# Patient Record
Sex: Female | Born: 1985 | Race: Black or African American | Hispanic: No | Marital: Single | Smoking: Current some day smoker
Health system: Southern US, Community
[De-identification: ages and names within clinical notes are randomized; demographics above are authoritative.]

## PROBLEM LIST (undated history)

## (undated) DIAGNOSIS — A749 Chlamydial infection, unspecified: Secondary | ICD-10-CM

## (undated) DIAGNOSIS — D649 Anemia, unspecified: Secondary | ICD-10-CM

## (undated) DIAGNOSIS — F419 Anxiety disorder, unspecified: Secondary | ICD-10-CM

## (undated) DIAGNOSIS — G4700 Insomnia, unspecified: Secondary | ICD-10-CM

## (undated) HISTORY — PX: EYE SURGERY: SHX253

---
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
## 2006-05-12 ENCOUNTER — Emergency Department (HOSPITAL_COMMUNITY): Admission: EM | Admit: 2006-05-12 | Discharge: 2006-05-12 | Payer: Self-pay | Admitting: Emergency Medicine

## 2006-12-01 ENCOUNTER — Emergency Department (HOSPITAL_COMMUNITY): Admission: EM | Admit: 2006-12-01 | Discharge: 2006-12-01 | Payer: Self-pay | Admitting: Emergency Medicine

## 2007-03-12 ENCOUNTER — Emergency Department (HOSPITAL_COMMUNITY): Admission: EM | Admit: 2007-03-12 | Discharge: 2007-03-12 | Payer: Self-pay | Admitting: Emergency Medicine

## 2007-08-27 ENCOUNTER — Emergency Department (HOSPITAL_COMMUNITY): Admission: EM | Admit: 2007-08-27 | Discharge: 2007-08-28 | Payer: Self-pay | Admitting: Emergency Medicine

## 2007-11-07 ENCOUNTER — Ambulatory Visit (HOSPITAL_COMMUNITY): Admission: RE | Admit: 2007-11-07 | Discharge: 2007-11-07 | Payer: Self-pay | Admitting: Family Medicine

## 2010-08-13 NOTE — L&D Delivery Note (Signed)
Delivery Note At  a viable female was delivered via NSVD (Presentation: ROA ;  ). Mild shoulder dystocia resolved in <30 seconds with McRoberts and suprapubic pressure.  APGAR: , ; weight .   Placenta status: spontaneous, intact.  Cord: 3-vessel with the following complications: none.   Anesthesia:  epidural Episiotomy: none Lacerations: none Est. Blood Loss (mL): 300  Mom to postpartum.  Baby to nursery-stable.  BOOTH, Ericah Scotto 05/02/2011, 11:03 AM

## 2010-12-29 NOTE — Group Therapy Note (Signed)
NAME:  Michelle [name], Michelle [name]                       ACCOUNT NO.:  [id number]   MEDICAL RECORD NO.:  [id number]                   PATIENT TYPE:  OUT   LOCATION:  [facility]                           FACILITY:  [facility]   PHYSICIAN:  Tinnie Gens, MD                     DATE OF BIRTH:  12/31/1985   DATE OF SERVICE:  07/01/2003                                    CLINIC NOTE   CHIEF COMPLAINT:  Yearly exam.   HISTORY OF PRESENT ILLNESS:  The patient is a 25 year old G0 who comes in  today and is brought in by her grandmother.  Apparently there is concern  that she had a chlamydia that was treated at some physician's office based  on a urine specimen.  She was treated with Zithromax at that time.  The  grandmother would like for her to have a pelvic exam although [place] is  not exactly thrilled with this idea.  She did become sexually active at age  42 and has had two partners.  She has fairly regular menses that come once a  month and menarche was at age 62.  She reports that her flow is heavy and  pain is moderate.  She does not want any birth control at this time as she  and her partner have agreed to abstinence.  The patient has never had a  pelvic or GYN exam or a Pap smear.   PAST MEDICAL HISTORY:  Significant for at some point in her life having  arthritis and high blood pressure but these seem to be resolved.  She also  had meningitis as a young child.  Apparently she lives with her brother and  her mother.  She does not drink alcohol or do any other drugs.   MEDICATIONS:  Claritin, ibuprofen, and iron pills.   ALLERGIES:  No known allergies.   SURGICAL HISTORY:  She has had no previous surgeries.   OBSTETRICAL HISTORY:  She is a G0.   GYNECOLOGICAL HISTORY:  There is questionable history of chlamydia although  I am not sure exactly how it was diagnosed.   FAMILY HISTORY:  Significant for hypertension and cancer as well as asthma,  allergies, anemia, and bipolar disorder.   REVIEW OF SYMPTOMS:  A 14-point review of systems is positive for muscle  aches, fatigue, weight loss and weight gain, dizzy spells, problems with  vision - she does wear glasses, problems with shortness of breath, nausea  and vomiting, and hot flashes.  It is unclear exactly how much of these  positive review of systems is related to or has something to do with  anxiety.   PHYSICAL EXAMINATION TODAY:  VITAL SIGNS:  She weighs 171.9, blood pressure  is 115/70, pulse is 89.  GENERAL:  She is a well-developed, well-nourished black female in no acute  distress.  GENITOURINARY:  Reveals normal external female genitalia.  Speculum was  placed.  She  has a nulliparous cervix.  The vagina is rugated and short.  There is no cervical motion tenderness.  She has no adnexal mass or  tenderness either.  The uterus is small and anteverted.   IMPRESSION:  1. Annual exam.  2. Questionable history of chlamydia.   PLAN:  Pap smear today; GC and chlamydia checked.  Also, had a lengthy  discussion with her about condom use and STD protection and she voiced  understanding about all these issues.  She also knows she can come back any  time she wants to get on a more effective birth control.                                               Tinnie Gens, MD    TP/MEDQ  D:  07/02/2003  T:  07/02/2003  Job:  [id number]

## 2011-02-15 ENCOUNTER — Inpatient Hospital Stay (HOSPITAL_COMMUNITY): Payer: Medicaid Other

## 2011-02-15 ENCOUNTER — Inpatient Hospital Stay (HOSPITAL_COMMUNITY)
Admission: AD | Admit: 2011-02-15 | Discharge: 2011-02-15 | Disposition: A | Discharge status: Home / Self Care | Payer: Medicaid Other | Source: Ambulatory Visit | Attending: Obstetrics & Gynecology | Admitting: Obstetrics & Gynecology

## 2011-02-15 DIAGNOSIS — O9989 Other specified diseases and conditions complicating pregnancy, childbirth and the puerperium: Secondary | ICD-10-CM

## 2011-02-15 DIAGNOSIS — O99891 Other specified diseases and conditions complicating pregnancy: Secondary | ICD-10-CM | POA: Insufficient documentation

## 2011-02-15 DIAGNOSIS — O0930 Supervision of pregnancy with insufficient antenatal care, unspecified trimester: Secondary | ICD-10-CM | POA: Insufficient documentation

## 2011-02-15 LAB — URINALYSIS, ROUTINE W REFLEX MICROSCOPIC
Nitrite: NEGATIVE
Specific Gravity, Urine: 1.01 (ref 1.005–1.030)
Urobilinogen, UA: 0.2 mg/dL (ref 0.0–1.0)

## 2011-02-15 LAB — URINE MICROSCOPIC-ADD ON

## 2011-03-21 ENCOUNTER — Inpatient Hospital Stay (HOSPITAL_COMMUNITY): Payer: Medicaid Other

## 2011-03-21 ENCOUNTER — Encounter (HOSPITAL_COMMUNITY): Payer: Self-pay

## 2011-03-21 ENCOUNTER — Inpatient Hospital Stay (HOSPITAL_COMMUNITY)
Admission: AD | Admit: 2011-03-21 | Discharge: 2011-03-21 | Disposition: A | Discharge status: Home / Self Care | Payer: Medicaid Other | Source: Ambulatory Visit | Attending: Obstetrics & Gynecology | Admitting: Obstetrics & Gynecology

## 2011-03-21 DIAGNOSIS — O0930 Supervision of pregnancy with insufficient antenatal care, unspecified trimester: Secondary | ICD-10-CM | POA: Insufficient documentation

## 2011-03-21 DIAGNOSIS — Z3480 Encounter for supervision of other normal pregnancy, unspecified trimester: Secondary | ICD-10-CM | POA: Insufficient documentation

## 2011-03-21 DIAGNOSIS — Z331 Pregnant state, incidental: Secondary | ICD-10-CM

## 2011-03-21 HISTORY — DX: Anemia, unspecified: D64.9

## 2011-03-21 LAB — DIFFERENTIAL
Eosinophils Absolute: 0.3 10*3/uL (ref 0.0–0.7)
Lymphs Abs: 2.3 10*3/uL (ref 0.7–4.0)
Monocytes Relative: 12 % (ref 3–12)
Neutrophils Relative %: 60 % (ref 43–77)

## 2011-03-21 LAB — TYPE AND SCREEN: ABO/RH(D): O POS

## 2011-03-21 LAB — CBC
Hemoglobin: 10.4 g/dL — ABNORMAL LOW (ref 12.0–15.0)
MCH: 24.1 pg — ABNORMAL LOW (ref 26.0–34.0)
RBC: 4.31 MIL/uL (ref 3.87–5.11)

## 2011-03-21 LAB — WET PREP, GENITAL
Trich, Wet Prep: NONE SEEN
Yeast Wet Prep HPF POC: NONE SEEN

## 2011-03-21 LAB — RUBELLA SCREEN: Rubella: 25.2 IU/mL — ABNORMAL HIGH

## 2011-03-21 LAB — URINALYSIS, ROUTINE W REFLEX MICROSCOPIC
Bilirubin Urine: NEGATIVE
Hgb urine dipstick: NEGATIVE
Protein, ur: NEGATIVE mg/dL
Urobilinogen, UA: 0.2 mg/dL (ref 0.0–1.0)

## 2011-03-21 LAB — SICKLE CELL SCREEN: Sickle Cell Screen: NEGATIVE

## 2011-03-21 LAB — STREP B DNA PROBE: GBS: NEGATIVE

## 2011-03-21 NOTE — ED Provider Notes (Signed)
Agree with above  Michelle [name]

## 2011-03-21 NOTE — Progress Notes (Signed)
Pt in c/o lower back pain since yesterday, denies any burning with urination, reports pressure- states "I do not have a doctor so it was time for to come here anyway".  Denies any bleeding or leaking of fluid.  + FM.

## 2011-03-21 NOTE — ED Provider Notes (Signed)
Pt seen and examined with PA-Student.  Agree with above note.  Pt states she presented due to abdominal pressure/pain intermittent for 1-2 days not associated with contractions ?braxton hicks contractions, no VB, no LOF, +FM.  Denies any additional symptoms. PMHx reviewed.   Meds reviewed SHx reviewed BP 105/59  Pulse 80  Temp(Src) 99.3 F (37.4 C) (Oral)  Resp 16  Ht 5\' 4"  (1.626 m)  Wt 83.462 kg (184 lb)  BMI 31.58 kg/m2 Chest CTAB Heart RRR no m/r/g Abdomen: soft NT, +BS, gravid, FH 34cm, vertex by leopolds, +hidradenitis suppurativa with ?superinfection of the mons on the left-+no notable drainage, +hidradenitis suppurativa bilateral underarms as well.   Extr. No c/c/e -pt taken to ultrasound to assess for growth.  Will check cultures.  Labs pending. Tylenol prn for pain.

## 2011-03-21 NOTE — Progress Notes (Cosign Needed)
No prenatal care, having back pain with pressure, thinks lost part of mucus plug a couple of days, no vaginal bleeding

## 2011-03-21 NOTE — ED Provider Notes (Signed)
History   Pt. Presents today for back pain and vaginal pressure that lasts for approximately one minute and will go away, irregular, it began yesterday.  Has no prenatal care, was seen in the MAU once approximately a month ago for dating and to "check on baby."  No fluid or blood leakage.  Believes she passed mucous plug approximately two days ago.  Chief Complaint  Patient presents with   Back Pain   HPI  OB History    Grav Para Term Preterm Abortions TAB SAB Ect Mult Living   2 1 1  0 0 0 0 0 0 1    No complications with previous pregnancy.  Induced vaginal delivery with no complications.  Past Medical History  Diagnosis Date   No pertinent past medical history     Past Surgical History  Procedure Date   No past surgeries     No family history on file.  History  Substance Use Topics   Smoking status: Former Smoker   Smokeless tobacco: Not on file   Alcohol Use: No    Allergies: Allergies not on file  No prescriptions prior to admission    Review of Systems  Constitutional: Negative for fever and chills.  Eyes: Negative for blurred vision and double vision.  Respiratory: Negative for cough and shortness of breath.   Cardiovascular: Negative for chest pain.  Gastrointestinal: Negative for abdominal pain.  Genitourinary: Negative for dysuria.  Musculoskeletal: Positive for back pain.  Neurological: Negative for dizziness and headaches.   Physical Exam   Blood pressure 105/59, pulse 80, temperature 99.3 F (37.4 C), temperature source Oral, resp. rate 16, height 5\' 4"  (1.626 m), weight 83.462 kg (184 lb).  Physical Exam  Constitutional: She appears well-developed and well-nourished.  Cardiovascular: Normal rate, regular rhythm and normal heart sounds.   No murmur heard. Respiratory: Breath sounds normal. She has no wheezes. She has no rales.  GI: There is no tenderness.  Skin: Skin is warm and dry.  Psychiatric: She has a normal mood and affect. Her  behavior is normal.    MAU Course  Procedures   Assessment and Plan  Normal pregnancy Prenatal labs F/U U/S   Katie Durland 03/21/2011, 11:58 AM

## 2011-03-24 LAB — CULTURE, BETA STREP (GROUP B ONLY)

## 2011-03-28 ENCOUNTER — Other Ambulatory Visit (HOSPITAL_COMMUNITY)
Admission: RE | Admit: 2011-03-28 | Discharge: 2011-03-28 | Disposition: A | Discharge status: Home / Self Care | Payer: Medicaid Other | Source: Ambulatory Visit | Attending: Family Medicine | Admitting: Family Medicine

## 2011-03-28 ENCOUNTER — Ambulatory Visit (INDEPENDENT_AMBULATORY_CARE_PROVIDER_SITE_OTHER): Payer: Medicaid Other | Admitting: Family Medicine

## 2011-03-28 DIAGNOSIS — Z01419 Encounter for gynecological examination (general) (routine) without abnormal findings: Secondary | ICD-10-CM | POA: Insufficient documentation

## 2011-03-28 DIAGNOSIS — O98319 Other infections with a predominantly sexual mode of transmission complicating pregnancy, unspecified trimester: Secondary | ICD-10-CM

## 2011-03-28 DIAGNOSIS — A749 Chlamydial infection, unspecified: Secondary | ICD-10-CM

## 2011-03-28 DIAGNOSIS — Z3480 Encounter for supervision of other normal pregnancy, unspecified trimester: Secondary | ICD-10-CM

## 2011-03-28 DIAGNOSIS — Z331 Pregnant state, incidental: Secondary | ICD-10-CM

## 2011-03-28 DIAGNOSIS — O0990 Supervision of high risk pregnancy, unspecified, unspecified trimester: Secondary | ICD-10-CM

## 2011-03-28 DIAGNOSIS — O0930 Supervision of pregnancy with insufficient antenatal care, unspecified trimester: Secondary | ICD-10-CM

## 2011-03-28 LAB — POCT URINALYSIS DIP (DEVICE)
Bilirubin Urine: NEGATIVE
Hgb urine dipstick: NEGATIVE
Ketones, ur: NEGATIVE mg/dL
Protein, ur: NEGATIVE mg/dL
Specific Gravity, Urine: 1.02 (ref 1.005–1.030)
pH: 7 (ref 5.0–8.0)

## 2011-03-28 MED ORDER — AZITHROMYCIN 500 MG PO TABS: 1000.0000 mg | ORAL_TABLET | Freq: Every day | ORAL | Status: AC

## 2011-03-28 NOTE — Progress Notes (Signed)
  Subjective:    Michelle [name] is being seen today for her first obstetrical visit.  This is not a planned pregnancy. She is at [redacted]w[redacted]d gestation. Her obstetrical history is significant for late to prenatal care, chlamydia infection.. Relationship with FOB: significant other, not living together. Reports significant argument this morning with FOB and thinks she may not want to be involved with him any more. Describes difficulty with getting him to assist with support of both her older child and this pregnancy; she is going to cosmetology school and is worried about how much time she will need/have off with the delivery, cost of day care, and getting needed supplies for her baby. Additionally, her primary family support (grandmother) may have to have surgery. Pt is upset, but declines Child psychotherapist consult today. Patient does intend to breast feed. Pregnancy history fully reviewed.  Patient reports no bleeding, no contractions, no cramping, no leaking and feeling the baby "ball up"..   Objective:     BP 109/69  Temp 97.5 F (36.4 C)  Wt 183 lb 12.8 oz (83.371 kg) Physical Exam  Exam Gen: AAO, mildly distressed, flat affect Heart: RRR, no murmur Lungs: CTA B/L Thyroid: Nonpalpable Abd: +BS, soft, gravid. Fundal Ht: 34 cm Pelvic Exam: NML external female genitalia. Internal: White d/c and friable cervix. Pap sample obtained. Cervix with internal os closed, cervix long. No CMT    Assessment:    Pregnancy: G2P1001 Patient Active Problem List  Diagnoses  . Supervision of other normal pregnancy    Chlamydia (positive from MAU testing)   Plan:     Initial labs drawn in MAU. Glucola today in clinic. Prenatal vitamins. Pregnancy Precautions discussed. Problem list reviewed and updated. AFP3 discussed: too late. Follow up in 1 weeks. Azithro 1000mg  for +Chlamydia. Will do test of cure in 2 weeks. Pt states she plans to abstain from intercourse due to current problems with FOB. Pt  desires PPS; will have pt sign tubal papers today. Pt may opt for Mirena if she changes her mind regarding BTL.   Chancy Milroy 03/28/2011

## 2011-03-28 NOTE — Progress Notes (Signed)
Pt is having some vaginal discharge and having pain at times. She is visibly upset but states nothing is wrong.

## 2011-03-28 NOTE — Patient Instructions (Signed)
HOME CARE INSTRUCTIONS Keep up with your usual exercises and instructions.  Take medications as directed.  Keep your regular prenatal appointment.  Eat and drink lightly if you think you are going into labor.   SEEK IMMEDIATE MEDICAL CARE IF: Your contractions continue to become stronger, more regular, and closer together.  You have a gushing, burst or leaking of fluid from the vagina.  An oral temperature above 100.4 F develops.  You have passage of blood-tinged mucus.  You develop vaginal bleeding.  You develop continuous belly (abdominal) pain.  You have low back pain that you never had before.  You feel the baby's head pushing down causing pelvic pressure.  The baby is not moving as much as it used to.  Document Released: 07/30/2005 Document Re-Released: 01/17/2010      IMPORTANT: HOW TO USE THIS INFORMATION:  This is a summary and does NOT have all possible information about this product. This information does not assure that this product is safe, effective, or appropriate for you. This information is not individual medical advice and does not substitute for the advice of your health care professional. Always ask your health care professional for complete information about this product and your specific health needs.    LEVONORGESTREL-RELEASING IMPLANT - INTRAUTERINE (lee-voh-nor-JEST-rell)    COMMON BRAND NAME(S): Mirena If there are no prenatal problems or other health problems associated with the pregnancy, it is completely safe to be sent home with false labor and await the onset of true labor. [employer] Patient Information 2011 [place], [place].   USES:  This product is a small, flexible device that is placed in the womb (uterus) to prevent pregnancy. It is used in women who desire reversible birth control that works for a long time (up to 5 years). The device works by slowly releasing a hormone (levonorgestrel) that is similar to a certain substance made by a woman's body.  This product is only intended for women who have previously given birth and have only one sexual partner. It is not meant for women with a history of certain infections/conditions (e.g., pelvic inflammatory disease, sexually transmitted disease, a certain problem pregnancy called ectopic pregnancy). For more information, consult your doctor. The use of this medication device does not protect you or your partner against sexually transmitted diseases (e.g., HIV, gonorrhea). Carefully read all of the information provided by your doctor, and ask any questions you may have about this product or other birth control methods that may be right for you.    HOW TO USE:  Read the Patient Information Leaflet provided by your pharmacist before this medication device is inserted and each time it is re-inserted. The leaflet contains very important information about side effects and when it is important to call your doctor. If you have any questions, consult your doctor or pharmacist. This product is inserted into your uterus by a properly trained health care professional, usually once every 5 years or as determined by your doctor. The medication in the device is slowly released into the body over a 5-year period. Have a follow-up appointment 4-12 weeks after insertion of this product to check that it is still correctly in place. If you still desire birth control after 5 years, the medication device may be replaced with a new one. The medication device may also be removed at any time by a properly trained health care professional. Learn all the instructions on how and when to check this product and its proper positioning in your body, and  make sure you understand the problems that may occur with this product. See also Precautions section.    SIDE EFFECTS:  Irregular vaginal bleeding (e.g., spotting), cramps, headache, nausea, breast pain, acne, rash, hair loss, weight gain, or decreased interest in sex may occur. If any of these  effects persist or worsen, tell your doctor promptly. Remember that your doctor has prescribed this medication device because he or she has judged that the benefit to you is greater than the risk of side effects. Many people using this medication device do not have serious side effects. Tell your doctor immediately if any of these serious side effects occur: lack of menstrual period, unexplained fever, chills, trouble breathing, mental/mood changes (e.g., depression, nervousness), vaginal swelling/itching, painful intercourse. Tell your doctor immediately if any of these unlikely but serious side effects occur: migraine/severe headache, vomiting, tiredness, fast/pounding heartbeat. Tell your doctor immediately if any of these highly unlikely but very serious side effects occur: prolonged or heavy vaginal bleeding, unusual vaginal discharge/odor, vaginal sores, abdominal/pelvic pain or tenderness, lumps in the breast, yellowing eyes/skin, dark urine, persistent nausea, trouble urinating. A very serious allergic reaction to this drug is rare. However, seek immediate medical attention if you notice any of the following symptoms of a serious allergic reaction: rash, itching/swelling (especially of the face/tongue/throat), severe dizziness, trouble breathing. This is not a complete list of possible side effects. If you notice other effects not listed above, contact your doctor or pharmacist. In the [place] - Call your doctor for medical advice about side effects. You may report side effects to FDA at 1-800-FDA-1088. In [place] - Call your doctor for medical advice about side effects. You may report side effects to [facility] [place] at [phone].    PRECAUTIONS:  Before using this medication device, tell your doctor or pharmacist if you are allergic to levonorgestrel, or to any other progestins (e.g., norethindrone, desogestrel); or if you have any other allergies. This product may contain inactive ingredients, which can  cause allergic reactions or other problems. Talk to your pharmacist for more details. This medication device should not be used if you have certain medical conditions. Before using this product, consult your doctor or pharmacist if you have: current known or suspected pregnancy, previous ectopic pregnancy, uterus problems (e.g., cancer, endometriosis, fibroids, pelvic inflammatory disease-PID), other IUD (intrauterine device) still in place, vaginal problems (e.g., infection), breast cancer, liver disease/tumors, any condition that affects your immune system (e.g., AIDS, leukemia). Before using this product, tell your doctor your medical history, especially of: bleeding problems (e.g., menstrual changes, clotting problems), heart problems (e.g., congenital valve conditions), high blood pressure, migraine headaches, stroke, diabetes. If you have diabetes, this medication may make it harder to control your blood sugar levels. Monitor your blood sugar regularly as directed by your doctor. Tell your doctor the results and any symptoms such as increased thirst/urination. Your anti-diabetic medication or diet may need to be adjusted. This medication device may sometimes come out by itself or move out of place. This may result in unwanted pregnancy or other problems. After each menstrual period, check to make sure it is in the right place. Talk to your doctor about how to check your device. If it comes out or you cannot feel its threads, call your doctor promptly, and use a backup birth control method such as condoms. If you or partner has any other sexual partners, this medication device may no longer be a good choice for pregnancy prevention. If you or your partner becomes HIV  positive, or if you think you may have been exposed to any sexually transmitted disease, contact your doctor immediately. You should consider having this device removed. This medication device must not be used during pregnancy. If you become  pregnant or think you may be pregnant, tell your doctor immediately. If you have just given birth and are not breast-feeding, or if you have had a pregnancy loss or abortion after the 3 months of pregnancy, wait at least 6 weeks (or as directed by your doctor) before using this medication device. Consult your doctor about the problems that may occur during pregnancy while using this product. Levonorgestrel passes into breast milk. Consult your doctor before breast-feeding.    DRUG INTERACTIONS:  Your doctor or pharmacist may already be aware of any possible drug interactions and may be monitoring you for them. Do not start, stop, or change the dosage of any medicine before checking with them first. Before using this medication device, tell your doctor of all prescription and nonprescription medications you may use, especially of: "blood thinners" (e.g., warfarin), birth control taken by mouth or applied to the skin (patch), certain drug used for varicose vein treatment (sodium tetradecyl sulfate), drugs that affect your immune response (e.g., corticosteroids such as prednisone). This document does not contain all possible interactions. Therefore, before using this product, tell your doctor or pharmacist of all the products you use. Keep a list of all your medications with you, and share the list with your doctor and pharmacist.    OVERDOSE:  Overdose with this medication is very unlikely because of the way the drug is released from this device. Consult your doctor or pharmacist for more information.    NOTES:  Do not share this medication with others. Keep all appointments with your doctor and the laboratory. You should have regular complete physical exams including blood pressure, breast exam, pelvic exam, and screening for cervical cancer (Pap smear). Follow your doctor's instructions for examining your own breasts, and report any lumps immediately. Consult your doctor for more details.    MISSED DOSE:   Not applicable.    STORAGE:  Before use, store at room temperature at 77 degrees F (25 degrees C) away from light and moisture. Brief storage between 59-86 degrees F (15-30 degrees C) is permitted. Keep all medications and medical devices away from children and pets. Do not flush medications down the toilet or pour them into a drain unless instructed to do so. Properly discard this product when it is expired or no longer needed. Consult your pharmacist or local waste disposal company for more details about how to safely discard your product.    Information last revised June 2010 Copyright(c) 2010 [employer].

## 2011-03-30 LAB — GLUCOSE TOLERANCE, 1 HOUR: Glucose, 1 Hour GTT: 92 mg/dL (ref 70–140)

## 2011-03-30 LAB — CULTURE, OB URINE: Colony Count: 80000

## 2011-04-04 ENCOUNTER — Ambulatory Visit (INDEPENDENT_AMBULATORY_CARE_PROVIDER_SITE_OTHER): Payer: Medicaid Other | Admitting: Obstetrics & Gynecology

## 2011-04-04 VITALS — BP 100/65 | HR 96 | Temp 97.9°F | Wt 186.3 lb

## 2011-04-04 DIAGNOSIS — O0930 Supervision of pregnancy with insufficient antenatal care, unspecified trimester: Secondary | ICD-10-CM

## 2011-04-04 LAB — POCT URINALYSIS DIP (DEVICE)
Glucose, UA: 250 mg/dL — AB
Ketones, ur: NEGATIVE mg/dL
Protein, ur: NEGATIVE mg/dL

## 2011-04-04 MED ORDER — FERROUS SULFATE 325 (65 FE) MG PO TBEC: 325.0000 mg | DELAYED_RELEASE_TABLET | Freq: Two times a day (BID) | ORAL | Status: DC

## 2011-04-04 MED ORDER — FAMOTIDINE 20 MG PO TABS: 40.0000 mg | ORAL_TABLET | ORAL | Status: DC

## 2011-04-04 NOTE — Progress Notes (Signed)
Rare contractions, some pressure, good FM. Neg GBS on 8/8. RTC 1 weeks

## 2011-04-04 NOTE — Progress Notes (Signed)
Pain on legs and lower back. Pressure on lower back.

## 2011-04-26 ENCOUNTER — Inpatient Hospital Stay (HOSPITAL_COMMUNITY)
Admission: AD | Admit: 2011-04-26 | Discharge: 2011-04-26 | Disposition: A | Discharge status: Home / Self Care | Payer: Medicaid Other | Source: Ambulatory Visit | Attending: Obstetrics & Gynecology | Admitting: Obstetrics & Gynecology

## 2011-04-26 ENCOUNTER — Encounter (HOSPITAL_COMMUNITY): Payer: Self-pay

## 2011-04-26 DIAGNOSIS — O479 False labor, unspecified: Secondary | ICD-10-CM

## 2011-04-26 MED ORDER — ZOLPIDEM TARTRATE ER 12.5 MG PO TBCR: 12.5000 mg | EXTENDED_RELEASE_TABLET | Freq: Every evening | ORAL | Status: DC | PRN

## 2011-04-26 NOTE — ED Provider Notes (Signed)
History     Chief Complaint  Patient presents with  . Abdominal Pain   HPI Patient reports worsening abdominal pain accompanied by pressure x 2 days. She states the pain is diffuse over her abdomen, intermittent, 7/10 associated with some nausea. She denies vomiting, HA, CP, SOB, vaginal bleeding, vaginal discharge. She admits to very good fetal movement. She admits to poor sleep over the past few days.    OB History    Grav Para Term Preterm Abortions TAB SAB Ect Mult Living   2 1 1  0 0 0 0 0 0 1      Past Medical History  Diagnosis Date  . Anemia     Past Surgical History  Procedure Date  . No past surgeries     No family history on file.  History  Substance Use Topics  . Smoking status: Former Games developer  . Smokeless tobacco: Not on file  . Alcohol Use: No    Allergies:  Allergies  Allergen Reactions  . Latex Itching    No prescriptions prior to admission    ROS As per HPI Physical Exam   Blood pressure 108/62, pulse 75, temperature 97.9 F (36.6 C), temperature source Axillary, resp. rate 20, last menstrual period 07/23/2010, SpO2 98.00%.  Physical Exam  Nursing note and vitals reviewed. Constitutional: She is oriented to person, place, and time. She appears well-developed and well-nourished. No distress.  Cardiovascular: Normal rate, regular rhythm and normal heart sounds.   GI:       Gravid, Fundal height 37 cm.   Genitourinary: Vagina normal.  Musculoskeletal: Normal range of motion. She exhibits no edema.  Neurological: She is alert and oriented to person, place, and time. She has normal reflexes.    Dilation: 1 Effacement (%): 40 Cervical Position: Middle Station: -1 Presentation: Vertex Exam by:: Dr. Armen Pickup  FHR:  140, mild variability, + accels 15x15, no decels. 3 accels in 20 minute period.  MAU Course  Procedures: Digital cervical exam. CST.     Assessment and Plan  A/P 25  yo multigravida at 83 and 5 by LMP. Reassuring CST. Not  in labor. 1. D/C to home with labor precautions. 2. Ambien prn insomnia.   Michelle [name] 04/26/2011, 1:14 PM

## 2011-04-26 NOTE — Progress Notes (Signed)
Pt states she is having lower abdominal pressure, not sure what contractions feel like. No bleeding or leaking and reports good fetal movement.

## 2011-04-30 ENCOUNTER — Telehealth: Payer: Self-pay | Admitting: Obstetrics and Gynecology

## 2011-04-30 NOTE — Telephone Encounter (Signed)
States just needed to verify appointment for this coming Wednesday 05/02/11. Verified with patient that appt date and time is correct. Patient agrees.

## 2011-05-02 ENCOUNTER — Encounter (HOSPITAL_COMMUNITY): Payer: Self-pay | Admitting: *Deleted

## 2011-05-02 ENCOUNTER — Encounter (HOSPITAL_COMMUNITY): Payer: Self-pay | Admitting: Anesthesiology

## 2011-05-02 ENCOUNTER — Inpatient Hospital Stay (HOSPITAL_COMMUNITY): Payer: Medicaid Other

## 2011-05-02 ENCOUNTER — Inpatient Hospital Stay (HOSPITAL_COMMUNITY)
Admission: AD | Admit: 2011-05-02 | Discharge: 2011-05-04 | DRG: 775 | Disposition: A | Discharge status: Home / Self Care | Payer: Medicaid Other | Source: Ambulatory Visit | Attending: Obstetrics & Gynecology | Admitting: Obstetrics & Gynecology

## 2011-05-02 ENCOUNTER — Inpatient Hospital Stay (HOSPITAL_COMMUNITY): Payer: Medicaid Other | Admitting: Anesthesiology

## 2011-05-02 DIAGNOSIS — O288 Other abnormal findings on antenatal screening of mother: Secondary | ICD-10-CM

## 2011-05-02 DIAGNOSIS — O0930 Supervision of pregnancy with insufficient antenatal care, unspecified trimester: Secondary | ICD-10-CM

## 2011-05-02 HISTORY — DX: Chlamydial infection, unspecified: A74.9

## 2011-05-02 LAB — CBC
MCH: 24.8 pg — ABNORMAL LOW (ref 26.0–34.0)
MCV: 77.8 fL — ABNORMAL LOW (ref 78.0–100.0)
Platelets: 294 10*3/uL (ref 150–400)
RBC: 4.64 MIL/uL (ref 3.87–5.11)
RDW: 18.1 % — ABNORMAL HIGH (ref 11.5–15.5)
WBC: 9.1 10*3/uL (ref 4.0–10.5)

## 2011-05-02 LAB — RAPID URINE DRUG SCREEN, HOSP PERFORMED
Barbiturates: NOT DETECTED
Cocaine: NOT DETECTED

## 2011-05-02 LAB — RPR: RPR Ser Ql: NONREACTIVE

## 2011-05-02 MED ORDER — ONDANSETRON HCL 4 MG/2ML IJ SOLN: 4.0000 mg | Freq: Four times a day (QID) | INTRAMUSCULAR | Status: DC | PRN

## 2011-05-02 MED ORDER — DIBUCAINE 1 % RE OINT: 1.0000 "application " | TOPICAL_OINTMENT | RECTAL | Status: DC | PRN

## 2011-05-02 MED ORDER — ONDANSETRON HCL 4 MG PO TABS: 4.0000 mg | ORAL_TABLET | ORAL | Status: DC | PRN

## 2011-05-02 MED ORDER — SENNOSIDES-DOCUSATE SODIUM 8.6-50 MG PO TABS
2.0000 | ORAL_TABLET | Freq: Every day | ORAL | Status: DC
  Administered 2011-05-02 – 2011-05-03 (×2): 2 via ORAL

## 2011-05-02 MED ORDER — EPHEDRINE 5 MG/ML INJ
INTRAVENOUS | Status: AC
  Filled 2011-05-02: qty 4

## 2011-05-02 MED ORDER — FLEET ENEMA 7-19 GM/118ML RE ENEM: 1.0000 | ENEMA | RECTAL | Status: DC | PRN

## 2011-05-02 MED ORDER — ZOLPIDEM TARTRATE 5 MG PO TABS: 5.0000 mg | ORAL_TABLET | Freq: Every evening | ORAL | Status: DC | PRN

## 2011-05-02 MED ORDER — LANOLIN HYDROUS EX OINT: TOPICAL_OINTMENT | CUTANEOUS | Status: DC | PRN

## 2011-05-02 MED ORDER — BENZOCAINE-MENTHOL 20-0.5 % EX AERO
INHALATION_SPRAY | CUTANEOUS | Status: AC
  Filled 2011-05-02: qty 56

## 2011-05-02 MED ORDER — PHENYLEPHRINE 40 MCG/ML (10ML) SYRINGE FOR IV PUSH (FOR BLOOD PRESSURE SUPPORT)
80.0000 ug | PREFILLED_SYRINGE | INTRAVENOUS | Status: DC | PRN
  Filled 2011-05-02: qty 5

## 2011-05-02 MED ORDER — LACTATED RINGERS IV SOLN: 500.0000 mL | Freq: Once | INTRAVENOUS | Status: DC

## 2011-05-02 MED ORDER — TERBUTALINE SULFATE 1 MG/ML IJ SOLN: 0.2500 mg | Freq: Once | INTRAMUSCULAR | Status: DC | PRN

## 2011-05-02 MED ORDER — WITCH HAZEL-GLYCERIN EX PADS: 1.0000 "application " | MEDICATED_PAD | CUTANEOUS | Status: DC | PRN

## 2011-05-02 MED ORDER — EPHEDRINE 5 MG/ML INJ
10.0000 mg | INTRAVENOUS | Status: DC | PRN
  Filled 2011-05-02: qty 4

## 2011-05-02 MED ORDER — SODIUM BICARBONATE 8.4 % IV SOLN
INTRAVENOUS | Status: DC | PRN
  Administered 2011-05-02: 5 mL via EPIDURAL

## 2011-05-02 MED ORDER — DIPHENHYDRAMINE HCL 25 MG PO CAPS: 25.0000 mg | ORAL_CAPSULE | Freq: Four times a day (QID) | ORAL | Status: DC | PRN

## 2011-05-02 MED ORDER — OXYTOCIN 20 UNITS IN LACTATED RINGERS INFUSION - SIMPLE: 125.0000 mL/h | Freq: Once | INTRAVENOUS | Status: DC

## 2011-05-02 MED ORDER — FENTANYL 2.5 MCG/ML BUPIVACAINE 1/10 % EPIDURAL INFUSION (WH - ANES)
INTRAMUSCULAR | Status: AC
  Filled 2011-05-02: qty 60

## 2011-05-02 MED ORDER — DIPHENHYDRAMINE HCL 50 MG/ML IJ SOLN: 12.5000 mg | INTRAMUSCULAR | Status: DC | PRN

## 2011-05-02 MED ORDER — OXYCODONE-ACETAMINOPHEN 5-325 MG PO TABS
1.0000 | ORAL_TABLET | ORAL | Status: DC | PRN
  Administered 2011-05-02 – 2011-05-04 (×4): 1 via ORAL
  Filled 2011-05-02 (×4): qty 1

## 2011-05-02 MED ORDER — BENZOCAINE-MENTHOL 20-0.5 % EX AERO
1.0000 "application " | INHALATION_SPRAY | CUTANEOUS | Status: DC | PRN
  Administered 2011-05-02: 1 via TOPICAL

## 2011-05-02 MED ORDER — PHENYLEPHRINE 40 MCG/ML (10ML) SYRINGE FOR IV PUSH (FOR BLOOD PRESSURE SUPPORT)
PREFILLED_SYRINGE | INTRAVENOUS | Status: AC
  Filled 2011-05-02: qty 5

## 2011-05-02 MED ORDER — PRENATAL PLUS 27-1 MG PO TABS
1.0000 | ORAL_TABLET | Freq: Every day | ORAL | Status: DC
  Administered 2011-05-02 – 2011-05-04 (×3): 1 via ORAL
  Filled 2011-05-02 (×3): qty 1

## 2011-05-02 MED ORDER — CITRIC ACID-SODIUM CITRATE 334-500 MG/5ML PO SOLN: 30.0000 mL | ORAL | Status: DC | PRN

## 2011-05-02 MED ORDER — SIMETHICONE 80 MG PO CHEW: 80.0000 mg | CHEWABLE_TABLET | ORAL | Status: DC | PRN

## 2011-05-02 MED ORDER — OXYCODONE-ACETAMINOPHEN 5-325 MG PO TABS: 2.0000 | ORAL_TABLET | ORAL | Status: DC | PRN

## 2011-05-02 MED ORDER — OXYTOCIN 20 UNITS IN LACTATED RINGERS INFUSION - SIMPLE
125.0000 mL/h | INTRAVENOUS | Status: DC
  Administered 2011-05-02: 999 mL/h via INTRAVENOUS
  Administered 2011-05-02: 2 m[IU]/min via INTRAVENOUS

## 2011-05-02 MED ORDER — LACTATED RINGERS IV SOLN
500.0000 mL | INTRAVENOUS | Status: DC | PRN
  Administered 2011-05-02: 500 mL via INTRAVENOUS

## 2011-05-02 MED ORDER — LIDOCAINE HCL (PF) 1 % IJ SOLN
30.0000 mL | INTRAMUSCULAR | Status: DC | PRN
  Filled 2011-05-02: qty 30

## 2011-05-02 MED ORDER — OXYTOCIN BOLUS FROM INFUSION
500.0000 mL | Freq: Once | INTRAVENOUS | Status: DC
  Filled 2011-05-02: qty 1000
  Filled 2011-05-02: qty 500

## 2011-05-02 MED ORDER — ACETAMINOPHEN 325 MG PO TABS: 650.0000 mg | ORAL_TABLET | ORAL | Status: DC | PRN

## 2011-05-02 MED ORDER — TETANUS-DIPHTH-ACELL PERTUSSIS 5-2.5-18.5 LF-MCG/0.5 IM SUSP
0.5000 mL | Freq: Once | INTRAMUSCULAR | Status: AC
  Administered 2011-05-03: 0.5 mL via INTRAMUSCULAR
  Filled 2011-05-02: qty 0.5

## 2011-05-02 MED ORDER — FENTANYL 2.5 MCG/ML BUPIVACAINE 1/10 % EPIDURAL INFUSION (WH - ANES)
14.0000 mL/h | INTRAMUSCULAR | Status: DC
  Administered 2011-05-02 (×2): 14 mL/h via EPIDURAL
  Filled 2011-05-02: qty 60

## 2011-05-02 MED ORDER — IBUPROFEN 600 MG PO TABS
600.0000 mg | ORAL_TABLET | Freq: Four times a day (QID) | ORAL | Status: DC
  Administered 2011-05-02 – 2011-05-04 (×9): 600 mg via ORAL
  Filled 2011-05-02 (×9): qty 1

## 2011-05-02 MED ORDER — ONDANSETRON HCL 4 MG/2ML IJ SOLN: 4.0000 mg | INTRAMUSCULAR | Status: DC | PRN

## 2011-05-02 MED ORDER — IBUPROFEN 600 MG PO TABS: 600.0000 mg | ORAL_TABLET | Freq: Four times a day (QID) | ORAL | Status: DC | PRN

## 2011-05-02 MED ORDER — NALBUPHINE SYRINGE 5 MG/0.5 ML
10.0000 mg | INJECTION | INTRAMUSCULAR | Status: DC | PRN
Start: 2011-05-02 — End: 2011-05-02
  Filled 2011-05-02: qty 1

## 2011-05-02 MED ORDER — LACTATED RINGERS IV SOLN
INTRAVENOUS | Status: DC
  Administered 2011-05-02: 05:00:00 via INTRAVENOUS
  Administered 2011-05-02: 125 mL/h via INTRAVENOUS

## 2011-05-02 MED ORDER — NALBUPHINE SYRINGE 5 MG/0.5 ML
10.0000 mg | INJECTION | INTRAMUSCULAR | Status: DC | PRN
  Filled 2011-05-02: qty 1

## 2011-05-02 NOTE — Progress Notes (Signed)
Returned from U/S

## 2011-05-02 NOTE — Anesthesia Procedure Notes (Signed)
Epidural Patient location during procedure: OB Start time: 05/02/2011 7:04 AM  Staffing Anesthesiologist: Jiles Garter  Preanesthetic Checklist Completed: patient identified, site marked, surgical consent, pre-op evaluation, timeout performed, IV checked, risks and benefits discussed and monitors and equipment checked  Epidural Patient position: sitting Prep: site prepped and draped and DuraPrep Patient monitoring: continuous pulse ox and blood pressure Approach: midline Injection technique: LOR air  Needle:  Needle type: Tuohy  Needle gauge: 17 G Needle length: 9 cm Needle insertion depth: 5 cm cm Catheter type: closed end flexible Catheter size: 19 Gauge Catheter at skin depth: 10 cm Test dose: negative  Assessment Events: blood not aspirated, injection not painful, no injection resistance, negative IV test and no paresthesia  Additional Notes Dosing of Epidural: 1st dose, through needle...... ( mg expressed as equavilent  cc's medication  from .1%Bupiv / fentanyl syringe from L&D pump)...............  5mg  Marcaine  2nd dose, through catheter after waiting 3 minutes.... epi 1:200K + Xylocaine 40 mg 3rd dose, through catheter, after waiting 3 minutes...Marland KitchenMarland Kitchenepi 1:200K + Xylocaine 60 mg ( 2% Xylo charted as a single dose in Epic Meds for ease of charting; actual dosing was fractionated as above, for saftey's sake)  As each dose occurred, patient was free of IV sx; and patient exhibited no evidence of SA injection.  Patient is more comfortable after epidural dosed. Please see RN's note for documentation of vital signs,and FHR which are stable.

## 2011-05-02 NOTE — Progress Notes (Signed)
Pt reports contractions off/on x 2 days, tonight contractions are stronger and closer together. G2P1

## 2011-05-02 NOTE — Plan of Care (Signed)
Problem: Consults Goal: Birthing Suites Patient Information Press F2 to bring up selections list Outcome: Completed/Met Date Met:  05/02/11  Pt > [redacted] weeks EGA     

## 2011-05-02 NOTE — Progress Notes (Signed)
UR chart review completed.  

## 2011-05-02 NOTE — H&P (Signed)
Michelle [name] is a 25 y.o. female G2P1001 presenting at 40.3 for contractions. Limited prenatal care ('didn't have MCD'). Had two visits at [facility] . Maternal Medical History:  Reason for admission: Reason for Admission:   nausea  OB History    Grav Para Term Preterm Abortions TAB SAB Ect Mult Living   2 1 1  0 0 0 0 0 0 1    Baby born in 2009, vag del in [place] Past Medical History  Diagnosis Date  . Anemia    Past Surgical History  Procedure Date  . No past surgeries    Family History: family history is not on file. Social History:  reports that she has quit smoking. She does not have any smokeless tobacco history on file. She reports that she does not drink alcohol or use illicit drugs.  Review of Systems  Constitutional: Negative for fever.  Gastrointestinal: Negative for nausea and vomiting.  Neurological: Negative for headaches.    Dilation: 2.5 Effacement (%): 70 Station: -2 Exam by:: [place],RN Cx exam unchanged an hour apart in MAU, however FHR was nonreactive after walking. BPP obtained: 6/8 with AFI 7cm and 'sluggish' FM per [place] tech.  Blood pressure 109/74, pulse 75, temperature 98.1 F (36.7 C), temperature source Oral, resp. rate 20, last menstrual period 07/23/2010. Maternal Exam:  Uterine Assessment: Contraction strength is mild.  Contraction frequency is irregular.   Abdomen: Patient reports no abdominal tenderness.   Physical Exam  Constitutional: She is oriented to person, place, and time. She appears well-developed and well-nourished.  HENT:  Head: Normocephalic.  Musculoskeletal: Normal range of motion.  Neurological: She is alert and oriented to person, place, and time.  Skin: Skin is warm and dry.  Psychiatric: She has a normal mood and affect. Her behavior is normal.    Prenatal labs: ABO, Rh: --/--/O POS, O POS (08/08 1210) Antibody: NEG (08/08 1210) Rubella:   RPR: NON REACTIVE (08/08 1210)  HBsAg: NEGATIVE (08/08 1210)  HIV: NON  REACTIVE (08/08 1210)  GBS: Negative (08/08 0000)   Assessment/Plan: IUP at term Nonreactive NST Favorable cx  Admit to L&D Begin Pitocin for induction/aug   Cam Hai 05/02/2011, 6:43 AM

## 2011-05-02 NOTE — Anesthesia Preprocedure Evaluation (Signed)

## 2011-05-02 NOTE — Progress Notes (Signed)
Michelle [name] is a 25 y.o. G2P1001 at [redacted]w[redacted]d by ultrasound admitted for induction of labor due to Non-reactive NST.  Subjective: Comfortable with epidural, does feel some pressure with contractions  Objective: BP 117/71  Pulse 68  Temp(Src) 97.6 F (36.4 C) (Oral)  Resp 20  SpO2 99%  LMP 07/23/2010      FHT:  FHR: 140 bpm, variability: minimal ,  accelerations:  absent,  decelerations:  Present mild variables UC:   regular, every 3 minutes SVE:   Dilation: 5 Effacement (%): 90 Station: -2 Exam by:: [employer]: Lab Results  Component Value Date   WBC 9.1 05/02/2011   HGB 11.5* 05/02/2011   HCT 36.1 05/02/2011   MCV 77.8* 05/02/2011   PLT 294 05/02/2011    Assessment / Plan: Induction of labor due to non-reassuring fetal testing,  progressing well on pitocin  Labor: progressing on pitocin, AROM at 1030 with clear fluid Preeclampsia:  n/a Fetal Wellbeing:  Category II Pain Control:  Epidural I/D:  n/a Anticipated MOD:  NSVD  [name], Michelle Rentfrow 05/02/2011, 10:33 AM

## 2011-05-03 LAB — URINALYSIS, ROUTINE W REFLEX MICROSCOPIC
Glucose, UA: NEGATIVE
Hgb urine dipstick: NEGATIVE
Protein, ur: NEGATIVE
Specific Gravity, Urine: 1.014
pH: 6.5

## 2011-05-03 LAB — PREGNANCY, URINE: Preg Test, Ur: POSITIVE

## 2011-05-03 NOTE — Anesthesia Postprocedure Evaluation (Signed)
  Anesthesia Post-op Note  Patient: Michelle [name]  Procedure(s) Performed: * No procedures listed *  Patient Location: PACU and Mother/Baby  Anesthesia Type: Epidural  Level of Consciousness: awake, alert  and oriented  Airway and Oxygen Therapy: Patient Spontanous Breathing  Post-op Pain: none  Post-op Assessment: Post-op Vital signs reviewed  Post-op Vital Signs: Reviewed and stable  Complications: No apparent anesthesia complications

## 2011-05-03 NOTE — Progress Notes (Signed)
Post Partum Day 1 Subjective: no complaints, up ad lib, voiding, tolerating PO, + flatus and no BM.  Br/Bo feeding.  Wants BTL, signed consent 8/29.  Objective: Blood pressure 100/67, pulse 64, temperature 97.7 F (36.5 C), temperature source Oral, resp. rate 18, last menstrual period 07/23/2010, SpO2 100.00%, unknown if currently breastfeeding.  Physical Exam:  General: alert, cooperative, appears stated age and no distress Lochia: appropriate Uterine Fundus: firm DVT Evaluation: No evidence of DVT seen on physical exam. Negative Homan's sign. No significant calf/ankle edema.   Basename 05/02/11 0445  HGB 11.5*  HCT 36.1    Assessment/Plan: Plan for discharge tomorrow, Breastfeeding, Lactation consult and Contraception wants a BTL, to discuss at [facility] visit as hasn't been 30 days since she signed consent.   LOS: 1 day   BOOTH, Monae Topping 05/03/2011, 8:55 AM

## 2011-05-03 NOTE — Progress Notes (Signed)
Chart states no PNC due to "didn't have Medicaid."  Patient now does have Medicaid.  Baby's UDS was negative.  SW to monitor Meconium drug screen.

## 2011-05-04 MED ORDER — IBUPROFEN 600 MG PO TABS: 600.0000 mg | ORAL_TABLET | Freq: Four times a day (QID) | ORAL | Status: AC

## 2011-05-04 MED ORDER — DOCUSATE SODIUM 100 MG PO CAPS: 100.0000 mg | ORAL_CAPSULE | Freq: Two times a day (BID) | ORAL | Status: AC | PRN

## 2011-05-04 MED ORDER — PRENATAL PLUS 27-1 MG PO TABS: 1.0000 | ORAL_TABLET | Freq: Every day | ORAL | Status: DC

## 2011-05-04 MED ORDER — INFLUENZA VIRUS VACC SPLIT PF IM SUSP
0.5000 mL | Freq: Once | INTRAMUSCULAR | Status: AC
  Administered 2011-05-04: 0.5 mL via INTRAMUSCULAR
  Filled 2011-05-04: qty 0.5

## 2011-05-04 NOTE — Discharge Summary (Signed)
Obstetric Discharge Summary Reason for Admission: induction of labor Prenatal Procedures: NST and ultrasound Intrapartum Procedures: spontaneous vaginal delivery Postpartum Procedures: none Complications-Operative and Postpartum: none Hemoglobin  Date Value Range Status  05/02/2011 11.5* 12.0-15.0 (g/dL) Final     HCT  Date Value Range Status  05/02/2011 36.1  36.0-46.0 (%) Final    Discharge Diagnoses: Term Pregnancy-delivered  Discharge Information: Date: 05/04/2011 Activity: pelvic rest Diet: routine Medications: PNV, Ibuprophen, Colace and Iron Condition: stable Instructions: refer to practice specific booklet Discharge to: home Follow-up Information    Follow up with [facility]. Call today. (for 4-6 week PP visit)    Contact information:   [address] [address]          Newborn Data: Live born female  Birth Weight: 5 lb 7.3 oz (2475 g) APGAR: 9, 9  Home with mother.  [name], Michelle Takacs 05/04/2011, 7:29 AM

## 2011-05-04 NOTE — Progress Notes (Signed)
Post Partum Day 2 Subjective: no complaints, up ad lib, voiding, tolerating PO, + flatus and no BM yet.  To discuss scheduling BTL at [facility] visit.  Br/Bo feeding.  Objective: Blood pressure 111/69, pulse 66, temperature 98 F (36.7 C), temperature source Oral, resp. rate 18, last menstrual period 07/23/2010, SpO2 100.00%, unknown if currently breastfeeding.  Physical Exam:  General: alert, cooperative, appears stated age and no distress Lochia: appropriate Uterine Fundus: firm DVT Evaluation: No evidence of DVT seen on physical exam. Negative Homan's sign. No significant calf/ankle edema.   Basename 05/02/11 0445  HGB 11.5*  HCT 36.1    Assessment/Plan: Discharge home   LOS: 2 days   BOOTH, Early Ord 05/04/2011, 7:26 AM

## 2011-05-05 LAB — GC/CHLAMYDIA PROBE AMP, URINE
Chlamydia, Swab/Urine, PCR: NEGATIVE
GC Probe Amp, Urine: NEGATIVE

## 2011-06-06 ENCOUNTER — Ambulatory Visit: Payer: Medicaid Other | Admitting: Obstetrics and Gynecology

## 2012-09-15 IMAGING — US US OB UMBILICAL ART DOPPLER
2 series · 12 of 28 positions shown · non-contrast
Comparison: none

[Series 1: us ob comp +14 wk · 2 of 6 slices shown (1 of 2)]
[im 2/6]
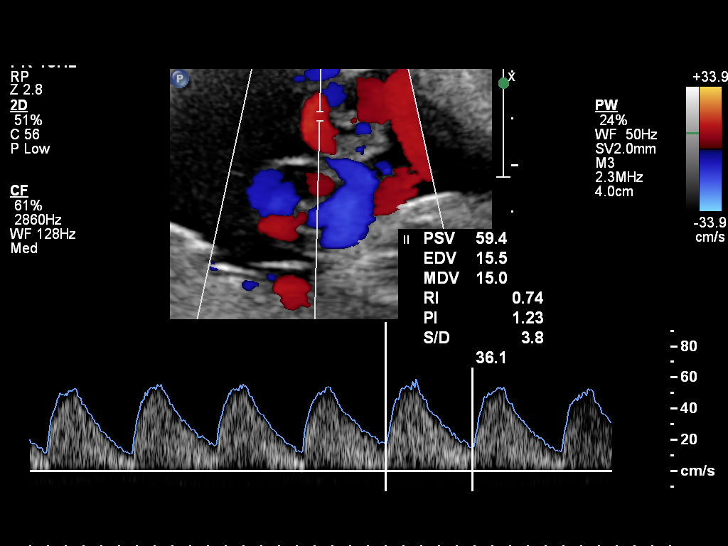
[im 6/6]
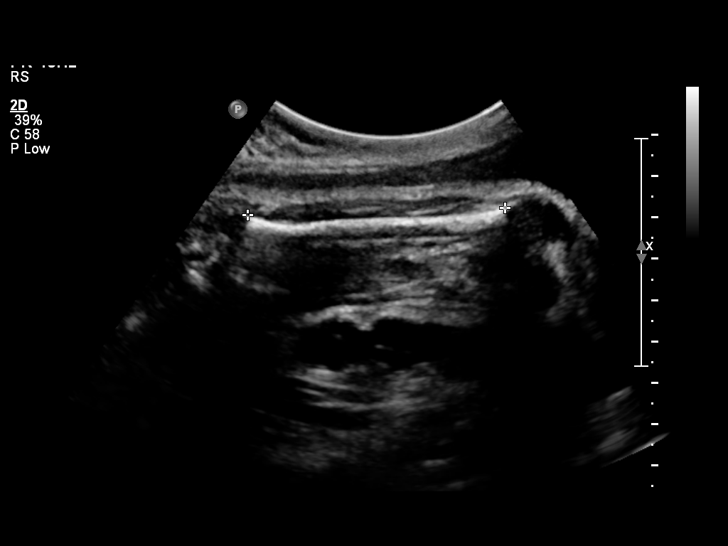

[Series 1: us ob comp +14 wk · 10 of 39 slices shown (2 of 2)]
[im 2/39]
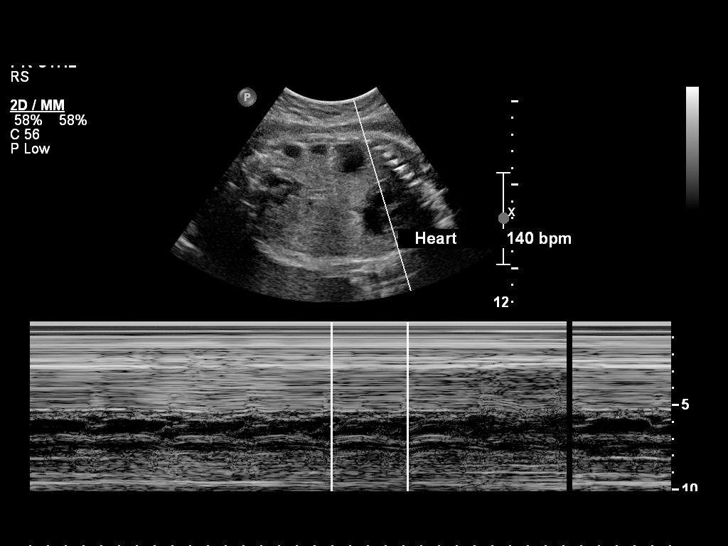
[im 7/39]
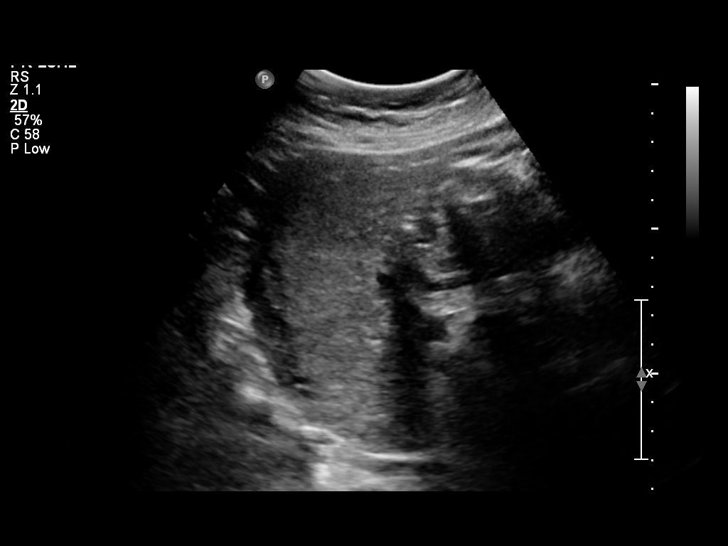
[im 10/39]
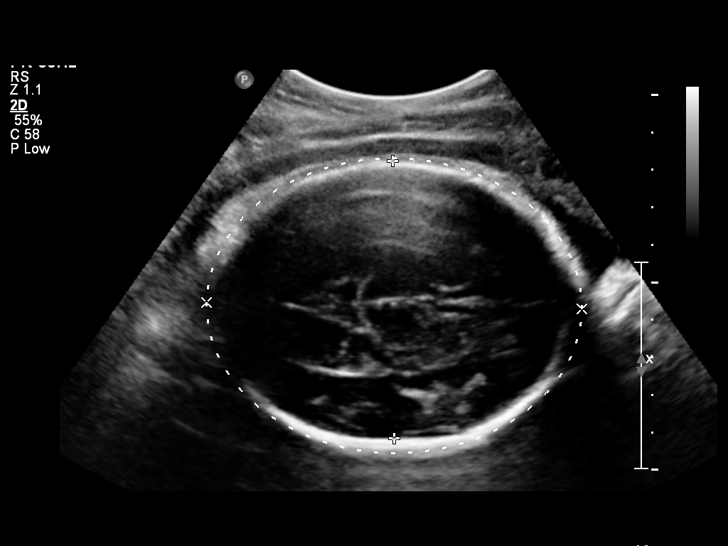
[im 14/39]
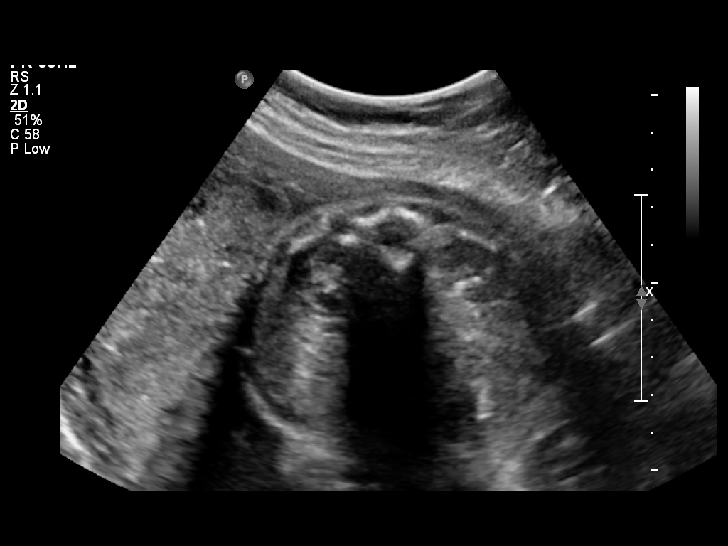
[im 19/39]
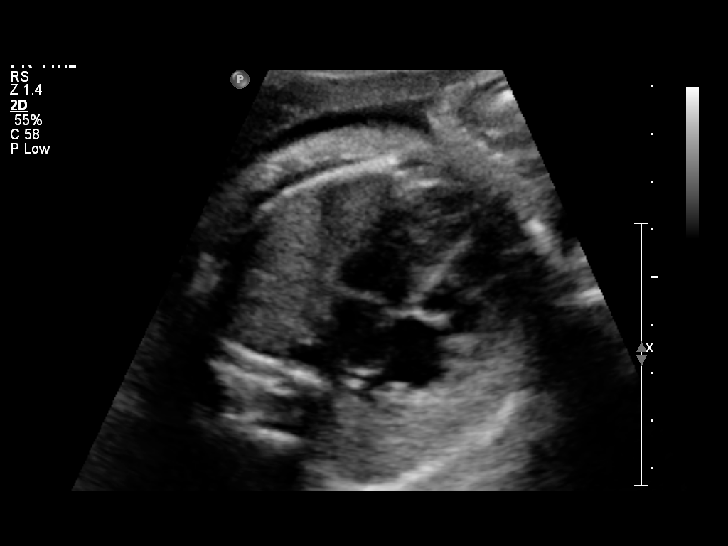
[im 22/39]
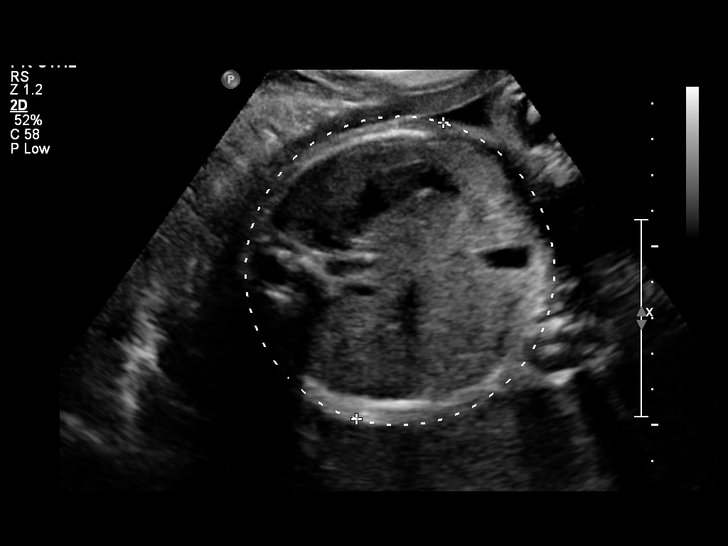
[im 25/39]
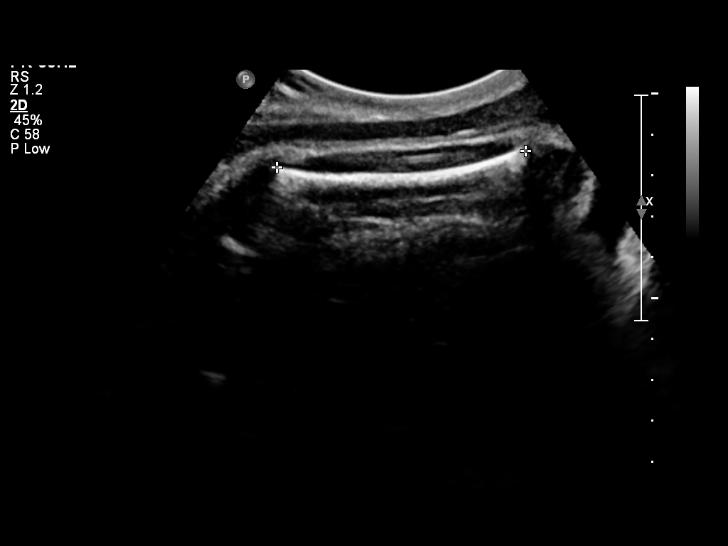
[im 30/39]
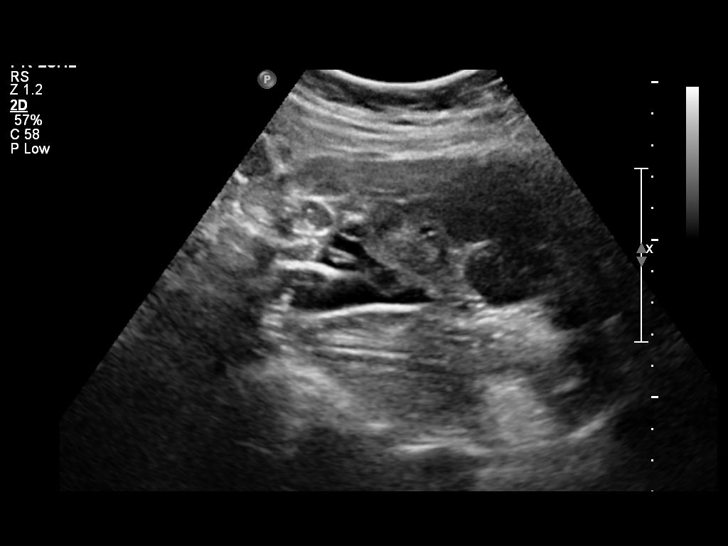
[im 34/39]
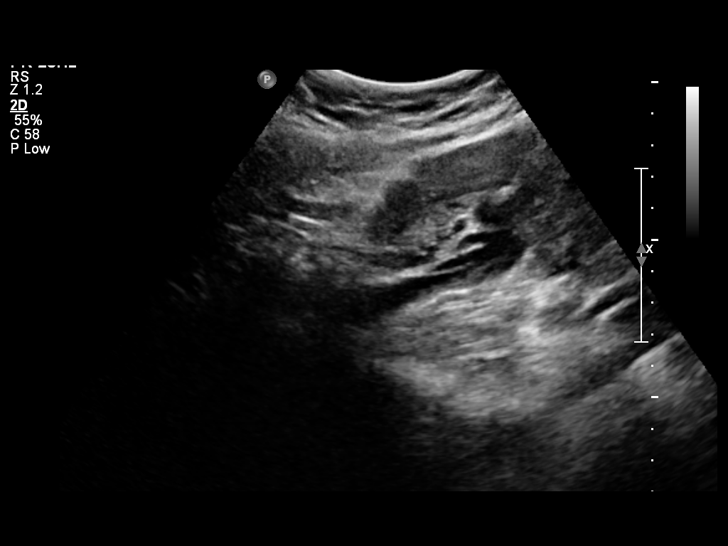
[im 37/39]
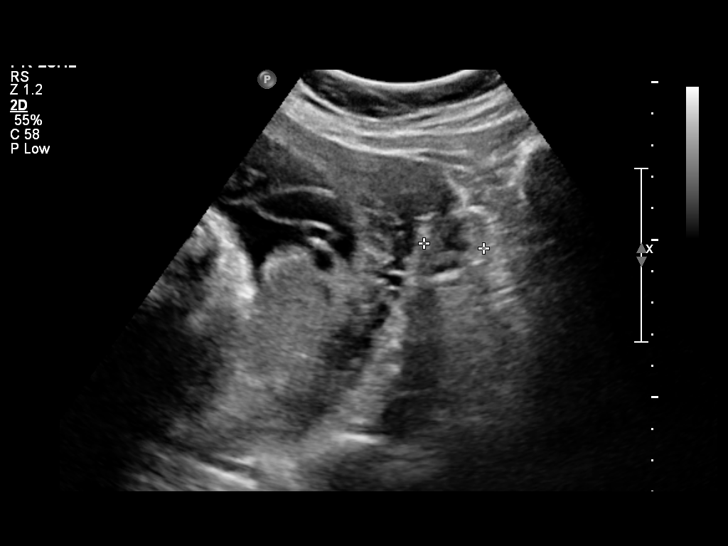

[12 of 28 positions shown; findings below may reference images not displayed]

OBSTETRICS REPORT
                      (Signed Final 03/21/2011 [DATE])

                 56_E
Procedures

 US OB FOLLOW UP                                       76816.1
 US UA CORD DOPPLER                                    76827.2
Indications

 Assess Fetal Growth / Estimated Fetal Weight
 No or Little Prenatal Care
Fetal Evaluation

 Fetal Heart Rate:  140                          bpm
 Cardiac Activity:  Observed
 Presentation:      Cephalic
 Placenta:          Posterior, above cervical
                    os

 Amniotic Fluid
 AFI FV:      Subjectively low-normal
 AFI Sum:     10.84   cm       26  %Tile     Larg Pckt:    3.96  cm
 RUQ:   1.44    cm   RLQ:    3.96   cm    LUQ:   3.19    cm   LLQ:    2.25   cm
Biometry

 BPD:     73.7  mm     G. Age:  29w 4d                CI:        69.88   70 - 86
                                                      FL/HC:      22.0   19.4 -

 HC:     281.3  mm     G. Age:  30w 6d      < 3  %    HC/AC:      1.01   0.96 -

 AC:     279.3  mm     G. Age:  32w 0d        4  %    FL/BPD:     84.0   71 - 87
 FL:      61.9  mm     G. Age:  32w 0d      < 3  %    FL/AC:      22.2   20 - 24

 Est. FW:    1018  gm           4 lb     14  %
Gestational Age

 LMP:           34w 3d        Date:  07/23/10                 EDD:   04/29/11
 U/S Today:     31w 1d                                        EDD:   05/22/11
 Best:          34w 3d     Det. By:  LMP  (07/23/10)          EDD:   04/29/11
Anatomy
 Cranium:           Appears normal      Aortic Arch:       Previously seen
 Fetal Cavum:       Previously seen     Ductal Arch:       Not well
                                                           visualized
 Ventricles:        Appears normal      Diaphragm:         Appears normal
 Choroid Plexus:    Previously seen     Stomach:           Appears
                                                           normal, left
                                                           sided
 Cerebellum:        Previously seen     Abdomen:           Previously seen
 Posterior Fossa:   Previously seen     Abdominal Wall:    Previously seen
 Nuchal Fold:       Not applicable      Cord Vessels:      Previously seen
                    (>20 wks GA)
 Face:              Lips previously     Kidneys:           Appear normal
                    seen
 Heart:             Not well            Bladder:           Appears normal
                    visualized
                    today. Seen
                    previously.
 RVOT:              Previously seen     Spine:             Previously seen
 LVOT:              Not well            Limbs:             Four extremities
                    visualized                             previously seen

 Other:     Female gender previously seen .
Doppler - Fetal Vessels

 Umbilical Artery
 S/D:   3.2            83  %tile
 Umbilical Artery
 Absent DFV:    No     Reverse DFV:    No

Impression

 Single living IUP with assigned GA of 34w 3d. EGA on
 today's ultrasound (31 w 1d) is 3 weeks and 2 days behind
 dating by LMP. Fetal growth has trended downward since the
 previous ultrasound of 02/15/2011. EFW is now at the 14th
 percentile for assigned GA. Findings are concerning for
 developing symmetric IUGR, assuming a correct LMP.
 Normal amniotic fluid volume.
 UA Doppler S/D ratio is at the 83rd percentile.
 Good fetal movement was noted by the sonographer.
Recommendations

 Contined follow-up for fetal growth.

## 2014-06-14 ENCOUNTER — Encounter (HOSPITAL_COMMUNITY): Payer: Self-pay | Admitting: *Deleted

## 2015-05-17 ENCOUNTER — Emergency Department (HOSPITAL_COMMUNITY)
Admission: EM | Admit: 2015-05-17 | Discharge: 2015-05-17 | Disposition: A | Discharge status: Home / Self Care | Payer: Medicaid Other | Attending: Emergency Medicine | Admitting: Emergency Medicine

## 2015-05-17 ENCOUNTER — Encounter (HOSPITAL_COMMUNITY): Payer: Self-pay | Admitting: Emergency Medicine

## 2015-05-17 DIAGNOSIS — Z91040 Latex allergy status: Secondary | ICD-10-CM | POA: Insufficient documentation

## 2015-05-17 DIAGNOSIS — Z87891 Personal history of nicotine dependence: Secondary | ICD-10-CM | POA: Insufficient documentation

## 2015-05-17 DIAGNOSIS — R110 Nausea: Secondary | ICD-10-CM | POA: Insufficient documentation

## 2015-05-17 DIAGNOSIS — Z8619 Personal history of other infectious and parasitic diseases: Secondary | ICD-10-CM | POA: Insufficient documentation

## 2015-05-17 DIAGNOSIS — K002 Abnormalities of size and form of teeth: Secondary | ICD-10-CM | POA: Insufficient documentation

## 2015-05-17 DIAGNOSIS — K0889 Other specified disorders of teeth and supporting structures: Secondary | ICD-10-CM | POA: Insufficient documentation

## 2015-05-17 DIAGNOSIS — Z79899 Other long term (current) drug therapy: Secondary | ICD-10-CM | POA: Insufficient documentation

## 2015-05-17 DIAGNOSIS — D649 Anemia, unspecified: Secondary | ICD-10-CM | POA: Insufficient documentation

## 2015-05-17 LAB — I-STAT BETA HCG BLOOD, ED (MC, WL, AP ONLY): I-stat hCG, quantitative: <5 m[IU]/mL (ref <5)

## 2015-05-17 MED ORDER — NAPROXEN 250 MG PO TABS
500.0000 mg | ORAL_TABLET | Freq: Once | ORAL | Status: AC
  Administered 2015-05-17: 500 mg via ORAL
  Filled 2015-05-17: qty 2

## 2015-05-17 MED ORDER — ONDANSETRON 4 MG PO TBDP
4.0000 mg | ORAL_TABLET | Freq: Once | ORAL | Status: AC
  Administered 2015-05-17: 4 mg via ORAL
  Filled 2015-05-17: qty 1

## 2015-05-17 MED ORDER — HYDROCODONE-ACETAMINOPHEN 5-325 MG PO TABS
2.0000 | ORAL_TABLET | Freq: Once | ORAL | Status: AC
  Administered 2015-05-17: 2 via ORAL
  Filled 2015-05-17: qty 2

## 2015-05-17 NOTE — ED Notes (Signed)
RN notified that pt is vomiting and crying.  RN obtained medication and explained to pt that it would be helpful to try to calm down so that she could keep the medication down.

## 2015-05-17 NOTE — ED Provider Notes (Signed)
CSN: [id number]     Arrival date & time 05/17/15  0413 History   First MD Initiated Contact with Patient 05/17/15 [phone]     Chief Complaint  Patient presents with  . Dental Pain     (Consider location/radiation/quality/duration/timing/severity/associated sxs/prior Treatment) HPI  Michelle [name] is a 29yo female,  No sig PMH, here with dental pain and nausea.  She states her dental pain is throughout her mouth and she has had dental problems in the past.  She does not currently have a dentist.  She is taking motrin without any relief.  She denies any swelling or fevers.  She denies any abdominal pain with her nausea.  She states she just feels sick to her stomach.  There has not been any diarrhea.  She has no further complaints.  10 Systems reviewed and are negative for acute change except as noted in the HPI.     Past Medical History  Diagnosis Date  . Anemia   . Chlamydia    Past Surgical History  Procedure Laterality Date  . No past surgeries     No family history on file. Social History  Substance Use Topics  . Smoking status: Former Games developer  . Smokeless tobacco: None  . Alcohol Use: No   OB History    Gravida Para Term Preterm AB TAB SAB Ectopic Multiple Living   0 0 0 0 0 0 2     Review of Systems    Allergies  Latex  Home Medications   Prior to Admission medications   Medication Sig Start Date End Date Taking? Authorizing Provider  famotidine (PEPCID) 20 MG tablet Take 40 mg by mouth as needed. For heart burn  04/04/11 04/03/12  Adam Phenix, MD  ferrous sulfate 325 (65 FE) MG EC tablet Take 325 mg by mouth daily.   04/04/11   Adam Phenix, MD  prenatal vitamin w/FE, FA (PRENATAL 1 + 1) 27-1 MG TABS Take 1 tablet by mouth daily. 05/04/11   Charm Rings, MD   BP 120/72 mmHg  Pulse 86  Temp(Src) 97.5 F (36.4 C) (Oral)  Resp 16  SpO2 100% Physical Exam  Constitutional: She is oriented to person, place, and time. She appears well-developed and  well-nourished. No distress.  HENT:  Head: Normocephalic and atraumatic.  Nose: Nose normal.  Mouth/Throat: Oropharynx is clear and moist. No oropharyngeal exudate.  Very poor dentition overall, no swelling or abscess formation  Eyes: Conjunctivae and EOM are normal. Pupils are equal, round, and reactive to light. No scleral icterus.  Neck: Normal range of motion. Neck supple. No JVD present. No tracheal deviation present. No thyromegaly present.  Cardiovascular: Normal rate, regular rhythm and normal heart sounds.  Exam reveals no gallop and no friction rub.   No murmur heard. Pulmonary/Chest: Effort normal and breath sounds normal. No respiratory distress. She has no wheezes. She exhibits no tenderness.  Abdominal: Soft. Bowel sounds are normal. She exhibits no distension and no mass. There is no tenderness. There is no rebound and no guarding.  Musculoskeletal: Normal range of motion. She exhibits no edema or tenderness.  Lymphadenopathy:    She has no cervical adenopathy.  Neurological: She is alert and oriented to person, place, and time. No cranial nerve deficit. She exhibits normal muscle tone.  Skin: Skin is warm and dry. No rash noted. No erythema. No pallor.  Nursing note and vitals reviewed.   ED Course  Procedures (including critical care time)  Labs Review Labs Reviewed  I-STAT BETA HCG BLOOD, ED ([facility], [facility], [facility] ONLY)    Imaging Review No results found. I have personally reviewed and evaluated these images and lab results as part of my medical decision-making.   EKG Interpretation None      MDM   Final diagnoses:  None    Patient presents to the ED for dental pain.  Will given norco for breakthrough pain, but will not provide Rx for narcotics for dental pain.  Patient encouraged to continue ibuprofen at home and see a dentist for ultimate care.  Follow up was provided.  Will check hcg to evaluate her nausea.  She has no abdominal pain, I do not believe labs of  imaging is warranted.  She was given zofran for nausea.  She appears well and in NAD. Her VS remain within her normal limits and she is safe for DC.   Tomasita Crumble, MD 05/17/15 [phone]

## 2015-05-17 NOTE — ED Notes (Signed)
Pt crying very loudly due to pain, MD notified.  RN explained to pt that medication has not had time to kick in as of yet.

## 2015-05-17 NOTE — ED Notes (Signed)
PT ambulated with baseline gait; VSS; A&Ox3; no signs of distress; respirations even and unlabored; skin warm and dry; no questions upon discharge.  

## 2015-05-17 NOTE — ED Notes (Signed)
Pt screaming and moaning in room; MD aware.

## 2015-05-17 NOTE — ED Notes (Signed)
Pt.is voimting  At this time.crying.

## 2015-05-17 NOTE — ED Notes (Signed)
Pt reports that she had dental pain that has been occuring for over a month.  She explains that she "just can't take the pain anymore."  Reports that over the counter pain medications are not working to help her pain.

## 2015-05-17 NOTE — Discharge Instructions (Signed)
Dental Pain Ms. Cerro, see a dentist within 3 days to for help with your teeth.  If you do not see a dentist, you can develop a severe infection in your mouth.  Continue tylenol or ibuprofen at home for pain control. If symptoms worsen, come back to the ED immediately.  Thank you.   Toothache is pain in or around a tooth. It may get worse with chewing or with cold or heat.  HOME CARE  Your dentist may use a numbing medicine during treatment. If so, you may need to avoid eating until the medicine wears off. Ask your dentist about this.  Only take medicine as told by your dentist or doctor.  Avoid chewing food near the painful tooth until after all treatment is done. Ask your dentist about this. GET HELP RIGHT AWAY IF:   The problem gets worse or new problems appear.  You have a fever.  There is redness and puffiness (swelling) of the face, jaw, or neck.  You cannot open your mouth.  There is pain in the jaw.  There is very bad pain that is not helped by medicine. MAKE SURE YOU:   Understand these instructions.  Will watch your condition.  Will get help right away if you are not doing well or get worse. Document Released: 01/16/2008 Document Revised: 10/22/2011 Document Reviewed: 01/16/2008 [facility] Patient Information 2015 [place], [place]. This information is not intended to replace advice given to you by your health care provider. Make sure you discuss any questions you have with your health care provider.

## 2015-05-17 NOTE — ED Notes (Signed)
Pt requesting narcotic px and is very unsatisfied customer; RN and MD explained that do not px narcotics for dental pain.

## 2017-08-13 NOTE — L&D Delivery Note (Addendum)
Delivery Note Patient is a 32 y.o. now G3P2002 s/p NSVD at 6953w2d, who was admitted for SROMx 2 days with a Category II strip.  Called to room for deep variables, and urge to push. Patient involuntarily pushing upon our arrival to room. She had progressed without augmentation to complete and pushed twice to deliver. At 7:40 PM a viable female was delivered via Vaginal, Spontaneous (Presentation: ROA).  APGAR: 8, 9; weight pending .  Cord clamping delayed by several minutes then clamped by SNM and cut by FOB. Cord intact, 3-vessel without complications: .  Placenta intact and spontaneous, bleeding minimal. No laceration repair needed.  Mom and baby stable prior to transfer to postpartum. She plans on formula feeding. She requests Nexplanon for birth control.    Anesthesia: Epidural Episiotomy: None Lacerations: None Suture Repair: none Est. Blood Loss (mL): 254  Mom to postpartum.  Baby to Couplet care / Skin to Skin.  Bernerd LimboJamilla R Walker, SNM 07/08/2018, 8:38 PM    I was gloved and present for entire delivery SVD without incident No difficulty with shoulders No lacerations  Clayton BiblesSamantha , CNM 07/08/18  8:56 PM

## 2018-03-13 ENCOUNTER — Other Ambulatory Visit (HOSPITAL_COMMUNITY): Payer: Self-pay | Admitting: Family

## 2018-03-13 DIAGNOSIS — Z3A20 20 weeks gestation of pregnancy: Secondary | ICD-10-CM

## 2018-03-13 DIAGNOSIS — Z8279 Family history of other congenital malformations, deformations and chromosomal abnormalities: Secondary | ICD-10-CM

## 2018-03-13 DIAGNOSIS — Z363 Encounter for antenatal screening for malformations: Secondary | ICD-10-CM

## 2018-03-13 LAB — OB RESULTS CONSOLE RUBELLA ANTIBODY, IGM: Rubella: IMMUNE

## 2018-03-13 LAB — OB RESULTS CONSOLE ABO/RH: RH Type: POSITIVE

## 2018-03-13 LAB — CYTOLOGY - PAP: PAP SMEAR: NEGATIVE

## 2018-03-13 LAB — CYSTIC FIBROSIS DIAGNOSTIC STUDY: Interpretation-CFDNA:: NEGATIVE

## 2018-03-13 LAB — OB RESULTS CONSOLE GC/CHLAMYDIA
CHLAMYDIA, DNA PROBE: POSITIVE
GC PROBE AMP, GENITAL: POSITIVE

## 2018-03-13 LAB — GLUCOSE TOLERANCE, 1 HOUR: GLUCOSE 1 HOUR GTT: 70

## 2018-03-13 LAB — OB RESULTS CONSOLE RPR: RPR: NONREACTIVE

## 2018-03-13 LAB — OB RESULTS CONSOLE ANTIBODY SCREEN: ANTIBODY SCREEN: NEGATIVE

## 2018-03-13 LAB — OB RESULTS CONSOLE HGB/HCT, BLOOD
HEMATOCRIT: 36
Hemoglobin: 11.3

## 2018-03-13 LAB — OB RESULTS CONSOLE VARICELLA ZOSTER ANTIBODY, IGG: Varicella: IMMUNE

## 2018-03-13 LAB — OB RESULTS CONSOLE HEPATITIS B SURFACE ANTIGEN: Hepatitis B Surface Ag: NEGATIVE

## 2018-03-13 LAB — OB RESULTS CONSOLE PLATELET COUNT: PLATELETS: 289

## 2018-03-17 ENCOUNTER — Encounter (HOSPITAL_COMMUNITY): Payer: Self-pay

## 2018-03-19 ENCOUNTER — Encounter (HOSPITAL_COMMUNITY): Payer: Self-pay

## 2018-03-20 ENCOUNTER — Encounter (HOSPITAL_COMMUNITY): Payer: Self-pay | Admitting: *Deleted

## 2018-03-24 ENCOUNTER — Inpatient Hospital Stay (HOSPITAL_COMMUNITY)
Admission: RE | Admit: 2018-03-24 | Discharge: 2018-03-24 | Disposition: A | Discharge status: Home / Self Care | Payer: Self-pay | Source: Ambulatory Visit

## 2018-03-24 ENCOUNTER — Ambulatory Visit (HOSPITAL_COMMUNITY): Payer: Medicaid Other

## 2018-03-31 ENCOUNTER — Encounter (HOSPITAL_COMMUNITY): Payer: Self-pay

## 2018-03-31 ENCOUNTER — Ambulatory Visit (HOSPITAL_COMMUNITY)
Admission: RE | Admit: 2018-03-31 | Discharge: 2018-03-31 | Disposition: A | Discharge status: Home / Self Care | Payer: Medicaid Other | Source: Ambulatory Visit | Attending: Family | Admitting: Family

## 2018-03-31 ENCOUNTER — Other Ambulatory Visit (HOSPITAL_COMMUNITY): Payer: Self-pay

## 2018-03-31 ENCOUNTER — Ambulatory Visit (HOSPITAL_COMMUNITY): Payer: Self-pay

## 2018-03-31 ENCOUNTER — Other Ambulatory Visit: Payer: Self-pay

## 2018-03-31 ENCOUNTER — Other Ambulatory Visit (HOSPITAL_COMMUNITY): Payer: Self-pay | Admitting: *Deleted

## 2018-03-31 DIAGNOSIS — Z8279 Family history of other congenital malformations, deformations and chromosomal abnormalities: Secondary | ICD-10-CM | POA: Insufficient documentation

## 2018-03-31 DIAGNOSIS — Z363 Encounter for antenatal screening for malformations: Secondary | ICD-10-CM | POA: Insufficient documentation

## 2018-03-31 DIAGNOSIS — Z3A20 20 weeks gestation of pregnancy: Secondary | ICD-10-CM | POA: Diagnosis present

## 2018-03-31 DIAGNOSIS — Z8489 Family history of other specified conditions: Secondary | ICD-10-CM | POA: Diagnosis not present

## 2018-03-31 DIAGNOSIS — Z362 Encounter for other antenatal screening follow-up: Secondary | ICD-10-CM

## 2018-04-01 ENCOUNTER — Encounter: Payer: Self-pay | Admitting: *Deleted

## 2018-04-01 LAB — HEMOGLOBINOPATHY EVALUATION
HGB C: 0 %
HGB F QUANT: 0 % (ref 0.0–2.0)
HGB S QUANTITAION: 0 %
Hgb A2 Quant: 2.4 % (ref 1.8–3.2)
Hgb A: 97.6 % (ref 96.4–98.8)
Hgb Variant: 0 %

## 2018-04-02 ENCOUNTER — Encounter: Payer: Self-pay | Admitting: General Practice

## 2018-04-02 ENCOUNTER — Encounter: Payer: Self-pay | Admitting: Family Medicine

## 2018-04-02 ENCOUNTER — Encounter: Payer: Self-pay | Admitting: Obstetrics and Gynecology

## 2018-04-07 ENCOUNTER — Other Ambulatory Visit (HOSPITAL_COMMUNITY): Payer: Self-pay

## 2018-04-09 ENCOUNTER — Encounter (HOSPITAL_COMMUNITY): Payer: Self-pay

## 2018-04-22 ENCOUNTER — Other Ambulatory Visit (HOSPITAL_COMMUNITY): Payer: Self-pay

## 2018-04-23 ENCOUNTER — Encounter (HOSPITAL_COMMUNITY): Payer: Self-pay

## 2018-04-28 ENCOUNTER — Ambulatory Visit (HOSPITAL_COMMUNITY)
Admission: RE | Admit: 2018-04-28 | Discharge: 2018-04-28 | Disposition: A | Discharge status: Home / Self Care | Payer: Medicaid Other | Source: Ambulatory Visit | Attending: Family | Admitting: Family

## 2018-04-28 ENCOUNTER — Encounter (HOSPITAL_COMMUNITY): Payer: Self-pay

## 2018-04-28 DIAGNOSIS — Z3A28 28 weeks gestation of pregnancy: Secondary | ICD-10-CM | POA: Diagnosis not present

## 2018-04-28 DIAGNOSIS — Z362 Encounter for other antenatal screening follow-up: Secondary | ICD-10-CM | POA: Insufficient documentation

## 2018-05-27 ENCOUNTER — Ambulatory Visit (INDEPENDENT_AMBULATORY_CARE_PROVIDER_SITE_OTHER): Payer: Medicaid Other | Admitting: Clinical

## 2018-05-27 ENCOUNTER — Encounter: Payer: Self-pay | Admitting: Obstetrics and Gynecology

## 2018-05-27 ENCOUNTER — Ambulatory Visit (INDEPENDENT_AMBULATORY_CARE_PROVIDER_SITE_OTHER): Payer: Medicaid Other | Admitting: Obstetrics and Gynecology

## 2018-05-27 ENCOUNTER — Other Ambulatory Visit (HOSPITAL_COMMUNITY)
Admission: RE | Admit: 2018-05-27 | Discharge: 2018-05-27 | Disposition: A | Discharge status: Home / Self Care | Payer: Medicaid Other | Source: Ambulatory Visit | Attending: Obstetrics and Gynecology | Admitting: Obstetrics and Gynecology

## 2018-05-27 DIAGNOSIS — Z202 Contact with and (suspected) exposure to infections with a predominantly sexual mode of transmission: Secondary | ICD-10-CM | POA: Diagnosis not present

## 2018-05-27 DIAGNOSIS — Z8279 Family history of other congenital malformations, deformations and chromosomal abnormalities: Secondary | ICD-10-CM

## 2018-05-27 DIAGNOSIS — F1290 Cannabis use, unspecified, uncomplicated: Secondary | ICD-10-CM

## 2018-05-27 DIAGNOSIS — K029 Dental caries, unspecified: Secondary | ICD-10-CM

## 2018-05-27 DIAGNOSIS — O98813 Other maternal infectious and parasitic diseases complicating pregnancy, third trimester: Secondary | ICD-10-CM

## 2018-05-27 DIAGNOSIS — A749 Chlamydial infection, unspecified: Secondary | ICD-10-CM | POA: Diagnosis not present

## 2018-05-27 DIAGNOSIS — F4323 Adjustment disorder with mixed anxiety and depressed mood: Secondary | ICD-10-CM

## 2018-05-27 DIAGNOSIS — Z3480 Encounter for supervision of other normal pregnancy, unspecified trimester: Secondary | ICD-10-CM | POA: Insufficient documentation

## 2018-05-27 DIAGNOSIS — Z659 Problem related to unspecified psychosocial circumstances: Secondary | ICD-10-CM

## 2018-05-27 DIAGNOSIS — Z3483 Encounter for supervision of other normal pregnancy, third trimester: Secondary | ICD-10-CM | POA: Diagnosis not present

## 2018-05-27 DIAGNOSIS — O98213 Gonorrhea complicating pregnancy, third trimester: Secondary | ICD-10-CM | POA: Diagnosis present

## 2018-05-27 DIAGNOSIS — Z3A32 32 weeks gestation of pregnancy: Secondary | ICD-10-CM | POA: Diagnosis not present

## 2018-05-27 DIAGNOSIS — O98819 Other maternal infectious and parasitic diseases complicating pregnancy, unspecified trimester: Secondary | ICD-10-CM

## 2018-05-27 DIAGNOSIS — Z658 Other specified problems related to psychosocial circumstances: Secondary | ICD-10-CM | POA: Diagnosis not present

## 2018-05-27 DIAGNOSIS — O98219 Gonorrhea complicating pregnancy, unspecified trimester: Secondary | ICD-10-CM | POA: Insufficient documentation

## 2018-05-27 NOTE — BH Specialist Note (Signed)
Integrated Behavioral Health Initial Visit  MRN: [id number] Name: Michelle [name]  Number of Integrated Behavioral Health Clinician visits:: 1/6 Session Start time: 11:21 Session End time: 11:45 Total time: 20 minutes  Type of Service: Integrated Behavioral Health- Individual/Family Interpretor:No. Interpretor Name and Language: n/a   Warm Hand Off Completed.       SUBJECTIVE: Michelle [name] is a 32 y.o. female accompanied by n/a Patient was referred by Venia Carbon, NP for Initial OB introduction to integrated behavioral health services . Patient reports the following symptoms/concerns: Pt states her primary concern today is life stress (financial; coping with relationship issues with FOB and parents, related to childhood trauma); pt says she uses meditation and water sounds to cope with escalating stress, and feels it's helpful to talk out her feelings today.  Duration of problem: Increase in current pregnancy; Severity of problem: moderate  OBJECTIVE: Mood: Anxious, Depressed and Irritable and Affect: Appropriate Risk of harm to self or others: No plan to harm self or others  LIFE CONTEXT: Family and Social: Pt lives with FOB and her children(10,7); most family and friends supportive School/Work: - Self-Care: Uses meditation and water sounds most days; difficulty taking on others' emotions Life Changes: Current pregnancy, new relationship past 7 months  GOALS ADDRESSED: Patient will: 1. Reduce symptoms of: agitation, anxiety, depression and stress 2. Increase knowledge and/or ability of: stress reduction  3. Demonstrate ability to: Increase healthy adjustment to current life circumstances  INTERVENTIONS: Interventions utilized: Supportive Counseling, Psychoeducation and/or Health Education and Link to [employer]  Standardized Assessments completed: GAD-7 and PHQ 9  ASSESSMENT: Patient currently experiencing Adjustment disorder with mixed anxious and  depressed mood and Psychosocial stress.   Patient may benefit from Initial OB introduction to integrated behavioral health services .  PLAN: 1. Follow up with behavioral health clinician on : Two weeks 2. Behavioral recommendations:  -Continue using daily self-coping strategies that remain helpful -Read educational materials regarding coping with symptoms of anxiety and depression  3. Referral(s): Integrated Art gallery manager (In Clinic) and [employer]:  Transportation 4. "From scale of 1-10, how likely are you to follow plan?": 9  Valetta Close Dwayna Kentner, LCSW  Depression screen [name] 2/9 05/27/2018  Decreased Interest 1  Down, Depressed, Hopeless 1  PHQ - 2 Score 2  Altered sleeping 3  Tired, decreased energy 2  Change in appetite 3  Feeling bad or failure about yourself  1  Trouble concentrating 0  Moving slowly or fidgety/restless 2  Suicidal thoughts 0  PHQ-9 Score 13   GAD 7 : Generalized Anxiety Score 05/27/2018  Nervous, Anxious, on Edge 3  Control/stop worrying 2  Worry too much - different things 3  Trouble relaxing 3  Restless 3  Easily annoyed or irritable 3  Afraid - awful might happen 1  Total GAD 7 Score 18

## 2018-05-27 NOTE — Progress Notes (Signed)
Subjective:   Michelle [name] is a 32 y.o. G3P2002 at [redacted]w[redacted]d by early ultrasound being seen today for her first obstetrical visit.  Her obstetrical history is significant for smoker, dental caries, GC/Chlamydia in pregnancy,  transfer of care from the HD due to small baby last pregnancy. Patient does not intend to breast feed. Pregnancy history fully reviewed.  Patient reports no complaints.  HISTORY: OB History  Gravida Para Term Preterm AB Living  3 2 2  0 0 2  SAB TAB Ectopic Multiple Live Births  0 0 0 0 2    # Outcome Date GA Lbr Len/2nd Weight Sex Delivery Anes PTL Lv  3 Current           2 Term 05/02/11 [redacted]w[redacted]d 12:19 / 00:05 5 lb 7.3 oz (2.475 kg) F Vag-Spont EPI  LIV     Name: JASON, HAUGE     Apgar1: 9  Apgar5: 9  1 Term 03/07/08 [redacted]w[redacted]d  6 lb 15 oz (3.147 kg) M Vag-Spont   LIV    Last pap smear was done 03/13/2018 and was normal  Past Medical History:  Diagnosis Date  . Anemia   . Chlamydia    Past Surgical History:  Procedure Laterality Date  . EYE SURGERY Left    No family history on file. Social History   Tobacco Use  . Smoking status: Former Games developer  . Smokeless tobacco: Never Used  Substance Use Topics  . Alcohol use: No  . Drug use: Not Currently    Types: Marijuana   Allergies  Allergen Reactions  . Latex Itching   Current Outpatient Medications on File Prior to Visit  Medication Sig Dispense Refill  . acetaminophen (TYLENOL) 325 MG tablet Take 650 mg by mouth every 6 (six) hours as needed.    . famotidine (PEPCID) 20 MG tablet Take 40 mg by mouth as needed. For heart burn     . ferrous sulfate 325 (65 FE) MG EC tablet Take 325 mg by mouth daily.      . prenatal vitamin w/FE, FA (PRENATAL 1 + 1) 27-1 MG TABS Take 1 tablet by mouth daily. 30 each 3   No current facility-administered medications on file prior to visit.     Review of Systems Pertinent items noted in HPI and remainder of comprehensive ROS otherwise negative.  Exam    Vitals:   05/27/18 1026  BP: 116/74  Pulse: 69  Weight: 169 lb 8 oz (76.9 kg)   Fetal Heart Rate (bpm): 138  Uterus:  Fundal Height: 31 cm  Pelvic Exam: Perineum: no hemorrhoids, normal perineum   Vulva: normal external genitalia, no lesions   Vagina:  normal mucosa, normal discharge  System: General: well-developed, well-nourished female in no acute distress   Breasts:  normal appearance, no masses or tenderness bilaterally   Skin: normal coloration and turgor, no rashes   Neurologic: oriented, normal, negative, normal mood   Extremities: normal strength, tone, and muscle mass, ROM of all joints is normal   HEENT PERRLA, extraocular movement intact and sclera clear, anicteric   Mouth/Teeth mucous membranes moist, pharynx normal without lesions and dental hygiene good   Neck supple and no masses   Cardiovascular: regular rate and rhythm   Respiratory:  no respiratory distress, normal breath sounds   Abdomen: soft, non-tender; bowel sounds normal; no masses,  no organomegaly    Assessment:   Pregnancy: [id number] Patient Active Problem List   Diagnosis Date Noted  . Gonorrhea  affecting pregnancy 05/27/2018  . Chlamydia infection affecting pregnancy 05/27/2018  . Supervision of other normal pregnancy, antepartum 05/27/2018  . Social problem 05/27/2018  . Dental caries 05/27/2018  . Supervision of other normal pregnancy 03/28/2011     Plan:   1. Gonorrhea affecting pregnancy in third trimester - Cervicovaginal ancillary only TOC   2. Chlamydia infection affecting pregnancy, antepartum - Cervicovaginal ancillary only TOC  3. Supervision of other normal pregnancy, antepartum - HgB A1c - HIV antibody (with reflex) - POCT Urine Drug Screen - TSH - Patient had normal 1 hour GTT at HD of 70   4. Social problem  Patient to see Asher Muir today   5. Dental caries  Initial labs drawn. Continue prenatal vitamins. Genetic Screening discussed, Quad negative:  Ultrasound  discussed; fetal anatomic survey: results reviewed. Problem list reviewed and updated. The nature of [facility] with multiple MDs and other Advanced Practice Providers was explained to patient; also emphasized that residents, students are part of our team. Routine obstetric precautions reviewed. Return in about 2 weeks (around 06/10/2018).    Rasch, Harolyn Rutherford, NP [facility] Practice Center for [employer], [facility]

## 2018-05-27 NOTE — Patient Instructions (Signed)

## 2018-05-28 DIAGNOSIS — Z202 Contact with and (suspected) exposure to infections with a predominantly sexual mode of transmission: Secondary | ICD-10-CM | POA: Insufficient documentation

## 2018-05-28 DIAGNOSIS — Z8279 Family history of other congenital malformations, deformations and chromosomal abnormalities: Secondary | ICD-10-CM | POA: Insufficient documentation

## 2018-05-28 DIAGNOSIS — F1290 Cannabis use, unspecified, uncomplicated: Secondary | ICD-10-CM | POA: Insufficient documentation

## 2018-05-28 LAB — TSH: TSH: 1.85 u[IU]/mL (ref 0.450–4.500)

## 2018-05-28 LAB — CERVICOVAGINAL ANCILLARY ONLY
CHLAMYDIA, DNA PROBE: NEGATIVE
Neisseria Gonorrhea: NEGATIVE

## 2018-05-28 LAB — HIV ANTIBODY (ROUTINE TESTING W REFLEX): HIV Screen 4th Generation wRfx: NONREACTIVE

## 2018-05-28 LAB — HEMOGLOBIN A1C
ESTIMATED AVERAGE GLUCOSE: 103 mg/dL
Hgb A1c MFr Bld: 5.2 % (ref 4.8–5.6)

## 2018-06-10 ENCOUNTER — Ambulatory Visit: Payer: Self-pay

## 2018-06-10 ENCOUNTER — Telehealth: Payer: Self-pay | Admitting: Student

## 2018-06-10 ENCOUNTER — Encounter: Payer: Self-pay | Admitting: Student

## 2018-06-10 NOTE — Telephone Encounter (Signed)
Patient missed her appointment this morning. She stated she would be at her next visit for sure.

## 2018-06-17 ENCOUNTER — Encounter: Payer: Self-pay | Admitting: Obstetrics and Gynecology

## 2018-06-18 ENCOUNTER — Encounter: Payer: Self-pay | Admitting: Nurse Practitioner

## 2018-06-24 ENCOUNTER — Other Ambulatory Visit (HOSPITAL_COMMUNITY)
Admission: RE | Admit: 2018-06-24 | Discharge: 2018-06-24 | Disposition: A | Discharge status: Home / Self Care | Payer: Medicaid Other | Source: Ambulatory Visit | Attending: Advanced Practice Midwife | Admitting: Advanced Practice Midwife

## 2018-06-24 ENCOUNTER — Ambulatory Visit (INDEPENDENT_AMBULATORY_CARE_PROVIDER_SITE_OTHER): Payer: Medicaid Other | Admitting: Advanced Practice Midwife

## 2018-06-24 VITALS — BP 123/76 | HR 56 | Wt 175.6 lb

## 2018-06-24 DIAGNOSIS — Z3480 Encounter for supervision of other normal pregnancy, unspecified trimester: Secondary | ICD-10-CM | POA: Diagnosis present

## 2018-06-24 DIAGNOSIS — Z3483 Encounter for supervision of other normal pregnancy, third trimester: Secondary | ICD-10-CM

## 2018-06-24 DIAGNOSIS — G4700 Insomnia, unspecified: Secondary | ICD-10-CM

## 2018-06-24 LAB — OB RESULTS CONSOLE GC/CHLAMYDIA: Gonorrhea: NEGATIVE

## 2018-06-24 LAB — OB RESULTS CONSOLE GBS: GBS: NEGATIVE

## 2018-06-24 MED ORDER — ZOLPIDEM TARTRATE 5 MG PO TABS: 5.0000 mg | ORAL_TABLET | Freq: Every evening | ORAL | 1 refills | Status: DC | PRN

## 2018-06-24 NOTE — Patient Instructions (Signed)
Contraception Choices Contraception, also called birth control, refers to methods or devices that prevent pregnancy. Hormonal methods Contraceptive implant A contraceptive implant is a thin, plastic tube that contains a hormone. It is inserted into the upper part of the arm. It can remain in place for up to 3 years. Progestin-only injections Progestin-only injections are injections of progestin, a synthetic form of the hormone progesterone. They are given every 3 months by a health care provider. Birth control pills Birth control pills are pills that contain hormones that prevent pregnancy. They must be taken once a day, preferably at the same time each day. Birth control patch The birth control patch contains hormones that prevent pregnancy. It is placed on the skin and must be changed once a week for three weeks and removed on the fourth week. A prescription is needed to use this method of contraception. Vaginal ring A vaginal ring contains hormones that prevent pregnancy. It is placed in the vagina for three weeks and removed on the fourth week. After that, the process is repeated with a new ring. A prescription is needed to use this method of contraception. Emergency contraceptive Emergency contraceptives prevent pregnancy after unprotected sex. They come in pill form and can be taken up to 5 days after sex. They work best the sooner they are taken after having sex. Most emergency contraceptives are available without a prescription. This method should not be used as your only form of birth control. Barrier methods Female condom A female condom is a thin sheath that is worn over the penis during sex. Condoms keep sperm from going inside a woman's body. They can be used with a spermicide to increase their effectiveness. They should be disposed after a single use. Female condom A female condom is a soft, loose-fitting sheath that is put into the vagina before sex. The condom keeps sperm from going  inside a woman's body. They should be disposed after a single use. Diaphragm A diaphragm is a soft, dome-shaped barrier. It is inserted into the vagina before sex, along with a spermicide. The diaphragm blocks sperm from entering the uterus, and the spermicide kills sperm. A diaphragm should be left in the vagina for 6-8 hours after sex and removed within 24 hours. A diaphragm is prescribed and fitted by a health care provider. A diaphragm should be replaced every 1-2 years, after giving birth, after gaining more than 15 lb (6.8 kg), and after pelvic surgery. Cervical cap A cervical cap is a round, soft latex or plastic cup that fits over the cervix. It is inserted into the vagina before sex, along with spermicide. It blocks sperm from entering the uterus. The cap should be left in place for 6-8 hours after sex and removed within 48 hours. A cervical cap must be prescribed and fitted by a health care provider. It should be replaced every 2 years. Sponge A sponge is a soft, circular piece of polyurethane foam with spermicide on it. The sponge helps block sperm from entering the uterus, and the spermicide kills sperm. To use it, you make it wet and then insert it into the vagina. It should be inserted before sex, left in for at least 6 hours after sex, and removed and thrown away within 30 hours. Spermicides Spermicides are chemicals that kill or block sperm from entering the cervix and uterus. They can come as a cream, jelly, suppository, foam, or tablet. A spermicide should be inserted into the vagina with an applicator at least 10-15 minutes before   sex to allow time for it to work. The process must be repeated every time you have sex. Spermicides do not require a prescription. Intrauterine contraception Intrauterine device (IUD) An IUD is a T-shaped device that is put in a woman's uterus. There are two types:  Hormone IUD.This type contains progestin, a synthetic form of the hormone progesterone. This  type can stay in place for 3-5 years.  Copper IUD.This type is wrapped in copper wire. It can stay in place for 10 years.  Permanent methods of contraception Female tubal ligation In this method, a woman's fallopian tubes are sealed, tied, or blocked during surgery to prevent eggs from traveling to the uterus. Hysteroscopic sterilization In this method, a small, flexible insert is placed into each fallopian tube. The inserts cause scar tissue to form in the fallopian tubes and block them, so sperm cannot reach an egg. The procedure takes about 3 months to be effective. Another form of birth control must be used during those 3 months. Female sterilization This is a procedure to tie off the tubes that carry sperm (vasectomy). After the procedure, the man can still ejaculate fluid (semen). Natural planning methods Natural family planning In this method, a couple does not have sex on days when the woman could become pregnant. Calendar method This means keeping track of the length of each menstrual cycle, identifying the days when pregnancy can happen, and not having sex on those days. Ovulation method In this method, a couple avoids sex during ovulation. Symptothermal method This method involves not having sex during ovulation. The woman typically checks for ovulation by watching changes in her temperature and in the consistency of cervical mucus. Post-ovulation method In this method, a couple waits to have sex until after ovulation. Summary  Contraception, also called birth control, means methods or devices that prevent pregnancy.  Hormonal methods of contraception include implants, injections, pills, patches, vaginal rings, and emergency contraceptives.  Barrier methods of contraception can include female condoms, female condoms, diaphragms, cervical caps, sponges, and spermicides.  There are two types of IUDs (intrauterine devices). An IUD can be put in a woman's uterus to prevent pregnancy  for 3-5 years.  Permanent sterilization can be done through a procedure for males, females, or both.  Natural family planning methods involve not having sex on days when the woman could become pregnant. This information is not intended to replace advice given to you by your health care provider. Make sure you discuss any questions you have with your health care provider. Document Released: 07/30/2005 Document Revised: 09/01/2016 Document Reviewed: 09/01/2016 Elsevier Interactive Patient Education  2018 Elsevier Inc.  

## 2018-06-24 NOTE — Progress Notes (Signed)
   PRENATAL VISIT NOTE  Subjective:  Michelle [name] is a 32 y.o. G3P2002 at 4354w2d being seen today for ongoing prenatal care.  She is currently monitored for the following issues for this low-risk pregnancy and has Supervision of other normal pregnancy; Gonorrhea affecting pregnancy; Chlamydia infection affecting pregnancy; Supervision of other normal pregnancy, antepartum; Social problem; Dental caries; Family history of congenital anomalies; Trichomonas contact, treated; and Uses marijuana on their problem list.  Patient reports occasional contractions.  Contractions: Irregular. Vag. Bleeding: None.  Movement: Present. Denies leaking of fluid.   The following portions of the patient's history were reviewed and updated as appropriate: allergies, current medications, past family history, past medical history, past social history, past surgical history and problem list. Problem list updated.  Objective:   Vitals:   06/24/18 1132  BP: 123/76  Pulse: (!) 56  Weight: 175 lb 9.6 oz (79.7 kg)    Fetal Status: Fetal Heart Rate (bpm): 145   Movement: Present     General:  Alert, oriented and cooperative. Patient is in no acute distress.  Skin: Skin is warm and dry. No rash noted.   Cardiovascular: Normal heart rate noted  Respiratory: Normal respiratory effort, no problems with respiration noted  Abdomen: Soft, gravid, appropriate for gestational age.  Pain/Pressure: Present     Pelvic: Cervical exam performed      0.5/long/ballotable   Extremities: Normal range of motion.  Edema: None  Mental Status: Normal mood and affect. Normal behavior. Normal judgment and thought content.   Assessment and Plan:  Pregnancy: G3P2002 at 454w2d  1. Supervision of other normal pregnancy, antepartum  - GC/Chlamydia probe amp ([place] - Culture, beta strep (group b only)  Term labor symptoms and general obstetric precautions including but not limited to vaginal bleeding, contractions,  leaking of fluid and fetal movement were reviewed in detail with the patient. Please refer to After Visit Summary for other counseling recommendations.  Return in about 1 week (around 07/01/2018) for ROB.  No future appointments.  Dorathy KinsmanVirginia Angeles Paolucci, CNM

## 2018-06-25 LAB — GC/CHLAMYDIA PROBE AMP (~~LOC~~) NOT AT ARMC
Chlamydia: NEGATIVE
Neisseria Gonorrhea: NEGATIVE

## 2018-06-27 LAB — CULTURE, BETA STREP (GROUP B ONLY): Strep Gp B Culture: NEGATIVE

## 2018-07-01 ENCOUNTER — Encounter: Payer: Self-pay | Admitting: Student

## 2018-07-02 ENCOUNTER — Ambulatory Visit (INDEPENDENT_AMBULATORY_CARE_PROVIDER_SITE_OTHER): Payer: Medicaid Other | Admitting: Nurse Practitioner

## 2018-07-02 VITALS — BP 115/63 | HR 64 | Wt 177.8 lb

## 2018-07-02 DIAGNOSIS — Z3A37 37 weeks gestation of pregnancy: Secondary | ICD-10-CM | POA: Diagnosis not present

## 2018-07-02 DIAGNOSIS — Z3483 Encounter for supervision of other normal pregnancy, third trimester: Secondary | ICD-10-CM

## 2018-07-02 DIAGNOSIS — Z3480 Encounter for supervision of other normal pregnancy, unspecified trimester: Secondary | ICD-10-CM

## 2018-07-02 NOTE — Progress Notes (Addendum)
    Subjective:  Michelle [name] is a 32 y.o. G3P2002 at 5217w3d being seen today for ongoing prenatal care.  She is currently monitored for the following issues for this low-risk pregnancy and has Supervision of other normal pregnancy; Gonorrhea affecting pregnancy; Chlamydia infection affecting pregnancy; Supervision of other normal pregnancy, antepartum; Social problem; Dental caries; Family history of congenital anomalies; Trichomonas contact, treated; and Uses marijuana on their problem list.  Patient reports no complaints.  Contractions: Irritability. Vag. Bleeding: None.  Movement: Present. Denies leaking of fluid.   The following portions of the patient's history were reviewed and updated as appropriate: allergies, current medications, past family history, past medical history, past social history, past surgical history and problem list. Problem list updated.  Objective:   Vitals:   07/02/18 1532  BP: 115/63  Pulse: 64  Weight: 177 lb 12.8 oz (80.6 kg)    Fetal Status: Fetal Heart Rate (bpm): 134 Fundal Height: 35 cm Movement: Present     General:  Alert, oriented and cooperative. Patient is in no acute distress.  Skin: Skin is warm and dry. No rash noted.   Cardiovascular: Normal heart rate noted  Respiratory: Normal respiratory effort, no problems with respiration noted  Abdomen: Soft, gravid, appropriate for gestational age. Pain/Pressure: Present     Pelvic:  Cervical exam deferred        Extremities: Normal range of motion.  Edema: None  Mental Status: Normal mood and affect. Normal behavior. Normal judgment and thought content.   Urinalysis:      Assessment and Plan:  Pregnancy: G3P2002 at 2417w3d  1. Supervision of other normal pregnancy, antepartum Still needs to see Dentist - [place] was in [place] and was not able to make the appointment. Discussed contraception - Thinks she wants Nexplanon - written info given.  Reviewed the bleeding profile with her. Urine  drug screen done today.  Term labor symptoms and general obstetric precautions including but not limited to vaginal bleeding, contractions, leaking of fluid and fetal movement were reviewed in detail with the patient. Please refer to After Visit Summary for other counseling recommendations.  Return in about 1 week (around 07/09/2018).  Nolene BernheimERRI BURLESON, RN, MSN, NP-BC Nurse Practitioner, [facility], [facility] 07/02/2018 3:46 PM

## 2018-07-02 NOTE — Patient Instructions (Signed)
AREA PEDIATRIC/FAMILY PRACTICE PHYSICIANS  [facility] [address], [address] [place], [place]  [address] Phone - [phone]   Fax - [phone]  [facility] [address] [place], [place] [address] Phone - [phone]   Fax - [phone]  [facility] [address] [place], [place]  [address] Phone - [phone]   Fax - [phone]  [facility] [address] [place], [place]  [address] Phone - [phone]   Fax - [phone]  [facility] [address] [place], [place]  [address] Phone - [phone]   Fax - [phone]  [facility] [address] [place], [place]  [address] Phone - [phone]   Fax - [phone]  [facility] [address] [place], [place]  [address] Phone - [phone]   Fax - [phone]  [facility] [address] [place], [place]  [address] Phone - [phone]   Fax - [phone]  [facility] [address] [place], [place]  [address] Phone - [phone]   Fax - [phone] [facility] [address] [place], [place]  [address] Phone - [phone]   Fax - [phone]  [facility] [address] [place], [place]  [address] Phone - [phone]   Fax - [phone]  [facility] [address] [place], [place]  [address] Phone - [phone]   Fax - [phone]  [facility] [address] [place], [place]  [address] Phone - [phone]   Fax - [phone]  [facility] [address] [place], [place]  [address] Phone - [phone]  [facility] [address] [place], [place]  [address] Phone - [phone]   Fax - [phone]  [facility] [address] [place], [place]  [address] Phone - [phone]   Fax - [phone]  [facility] [address] [place], [place]  [address] Phone - [phone]   Fax - [phone]  [facility] [address] [place], [place]  [address] Phone - [phone]   Fax - [phone]   [facility] [address] [place], [place]  [address] Phone - [phone]   Fax - [phone]  [facility] [address] [place], [place]  [address] Phone - [phone]   Fax - [phone]  [facility] [address] [place], [place]  [address] Phone - [phone]   Fax - [phone]  [facility] [address] [place], [place]  [address] Phone - [phone]   Fax - [phone]  [place] [facility] [address] [place], [place]  [address] Phone - [phone]   Fax - [phone] [facility] [address] [place], [place]  [address] Phone - [phone]   Fax - [phone]  [facility] [address] [place], [place]  [address] Phone - [phone]   Fax - [phone]  [facility] [address] [place], [place]  [address] Phone - [phone]   Fax - [phone]  [facility] Charlene Flemming MD [address]  [place] [address] Phone [phone]  Fax [phone]   

## 2018-07-02 NOTE — Addendum Note (Signed)
Addended by: Currie ParisBURLESON, TERRI L on: 07/02/2018 05:19 PM   Modules accepted: Orders

## 2018-07-06 LAB — URINE DRUGS OF ABUSE SCREEN W ALC, ROUTINE (REF LAB)
Amphetamines, Urine: NEGATIVE ng/mL
Barbiturate Quant, Ur: NEGATIVE ng/mL
Benzodiazepine Quant, Ur: NEGATIVE ng/mL
Cocaine (Metab.): NEGATIVE ng/mL
Ethanol, Urine: NEGATIVE %
Methadone Screen, Urine: NEGATIVE ng/mL
Opiate Quant, Ur: NEGATIVE ng/mL
PCP Quant, Ur: NEGATIVE ng/mL
Propoxyphene: NEGATIVE ng/mL

## 2018-07-06 LAB — PANEL 799049
CARBOXY THC GC/MS CONF: 86 ng/mL
Cannabinoid GC/MS, Ur: POSITIVE — AB

## 2018-07-08 ENCOUNTER — Inpatient Hospital Stay (HOSPITAL_COMMUNITY): Payer: Medicaid Other | Admitting: Anesthesiology

## 2018-07-08 ENCOUNTER — Inpatient Hospital Stay (HOSPITAL_COMMUNITY)
Admission: AD | Admit: 2018-07-08 | Discharge: 2018-07-10 | DRG: 807 | Disposition: A | Discharge status: Home / Self Care | Payer: Medicaid Other | Attending: Family Medicine | Admitting: Family Medicine

## 2018-07-08 ENCOUNTER — Encounter (HOSPITAL_COMMUNITY): Payer: Self-pay

## 2018-07-08 DIAGNOSIS — Z87891 Personal history of nicotine dependence: Secondary | ICD-10-CM | POA: Diagnosis not present

## 2018-07-08 DIAGNOSIS — Z3A38 38 weeks gestation of pregnancy: Secondary | ICD-10-CM

## 2018-07-08 DIAGNOSIS — O4292 Full-term premature rupture of membranes, unspecified as to length of time between rupture and onset of labor: Secondary | ICD-10-CM | POA: Diagnosis present

## 2018-07-08 DIAGNOSIS — F1210 Cannabis abuse, uncomplicated: Secondary | ICD-10-CM

## 2018-07-08 DIAGNOSIS — Z202 Contact with and (suspected) exposure to infections with a predominantly sexual mode of transmission: Secondary | ICD-10-CM

## 2018-07-08 DIAGNOSIS — O99324 Drug use complicating childbirth: Secondary | ICD-10-CM | POA: Diagnosis not present

## 2018-07-08 DIAGNOSIS — Z659 Problem related to unspecified psychosocial circumstances: Secondary | ICD-10-CM

## 2018-07-08 DIAGNOSIS — Z3480 Encounter for supervision of other normal pregnancy, unspecified trimester: Secondary | ICD-10-CM

## 2018-07-08 DIAGNOSIS — Z8279 Family history of other congenital malformations, deformations and chromosomal abnormalities: Secondary | ICD-10-CM

## 2018-07-08 DIAGNOSIS — F1290 Cannabis use, unspecified, uncomplicated: Secondary | ICD-10-CM

## 2018-07-08 DIAGNOSIS — Z3490 Encounter for supervision of normal pregnancy, unspecified, unspecified trimester: Secondary | ICD-10-CM

## 2018-07-08 DIAGNOSIS — K029 Dental caries, unspecified: Secondary | ICD-10-CM

## 2018-07-08 HISTORY — DX: Insomnia, unspecified: G47.00

## 2018-07-08 HISTORY — DX: Anxiety disorder, unspecified: F41.9

## 2018-07-08 LAB — TYPE AND SCREEN
ABO/RH(D): O POS
ANTIBODY SCREEN: NEGATIVE

## 2018-07-08 LAB — CBC
HEMATOCRIT: 37.1 % (ref 36.0–46.0)
HEMATOCRIT: 37.5 % (ref 36.0–46.0)
Hemoglobin: 11.8 g/dL — ABNORMAL LOW (ref 12.0–15.0)
Hemoglobin: 12 g/dL (ref 12.0–15.0)
MCH: 26.9 pg (ref 26.0–34.0)
MCH: 26.9 pg (ref 26.0–34.0)
MCHC: 31.8 g/dL (ref 30.0–36.0)
MCHC: 32 g/dL (ref 30.0–36.0)
MCV: 84.5 fL (ref 80.0–100.0)
MCV: 84.1 fL (ref 80.0–100.0)
Platelets: 349 10*3/uL (ref 150–400)
Platelets: 301 10*3/uL (ref 150–400)
RBC: 4.39 MIL/uL (ref 3.87–5.11)
RBC: 4.46 MIL/uL (ref 3.87–5.11)
RDW: 16.1 % — AB (ref 11.5–15.5)
RDW: 16.1 % — AB (ref 11.5–15.5)
WBC: 14.2 10*3/uL — AB (ref 4.0–10.5)
WBC: 17.9 10*3/uL — ABNORMAL HIGH (ref 4.0–10.5)
nRBC: 0 % (ref 0.0–0.2)
nRBC: 0 % (ref 0.0–0.2)

## 2018-07-08 LAB — RAPID URINE DRUG SCREEN, HOSP PERFORMED
AMPHETAMINES: NOT DETECTED
BARBITURATES: NOT DETECTED
BENZODIAZEPINES: NOT DETECTED
Cocaine: NOT DETECTED
Opiates: NOT DETECTED
Tetrahydrocannabinol: POSITIVE — AB

## 2018-07-08 LAB — POCT FERN TEST: POCT FERN TEST: POSITIVE

## 2018-07-08 MED ORDER — SOD CITRATE-CITRIC ACID 500-334 MG/5ML PO SOLN: 30.0000 mL | ORAL | Status: DC | PRN

## 2018-07-08 MED ORDER — IBUPROFEN 600 MG PO TABS
600.0000 mg | ORAL_TABLET | Freq: Four times a day (QID) | ORAL | Status: DC
  Administered 2018-07-09 – 2018-07-10 (×7): 600 mg via ORAL
  Filled 2018-07-08 (×7): qty 1

## 2018-07-08 MED ORDER — OXYCODONE-ACETAMINOPHEN 5-325 MG PO TABS: 2.0000 | ORAL_TABLET | ORAL | Status: DC | PRN

## 2018-07-08 MED ORDER — ONDANSETRON HCL 4 MG PO TABS: 4.0000 mg | ORAL_TABLET | ORAL | Status: DC | PRN

## 2018-07-08 MED ORDER — EPHEDRINE 5 MG/ML INJ
10.0000 mg | INTRAVENOUS | Status: DC | PRN
  Filled 2018-07-08: qty 2

## 2018-07-08 MED ORDER — ACETAMINOPHEN 325 MG PO TABS: 650.0000 mg | ORAL_TABLET | ORAL | Status: DC | PRN

## 2018-07-08 MED ORDER — PHENYLEPHRINE 40 MCG/ML (10ML) SYRINGE FOR IV PUSH (FOR BLOOD PRESSURE SUPPORT)
80.0000 ug | PREFILLED_SYRINGE | INTRAVENOUS | Status: DC | PRN
  Filled 2018-07-08: qty 10
  Filled 2018-07-08: qty 5

## 2018-07-08 MED ORDER — DIBUCAINE 1 % RE OINT: 1.0000 "application " | TOPICAL_OINTMENT | RECTAL | Status: DC | PRN

## 2018-07-08 MED ORDER — LACTATED RINGERS IV SOLN
500.0000 mL | Freq: Once | INTRAVENOUS | Status: AC
  Administered 2018-07-08: 1000 mL via INTRAVENOUS

## 2018-07-08 MED ORDER — OXYTOCIN BOLUS FROM INFUSION
500.0000 mL | Freq: Once | INTRAVENOUS | Status: AC
  Administered 2018-07-08: 500 mL via INTRAVENOUS

## 2018-07-08 MED ORDER — COCONUT OIL OIL: 1.0000 "application " | TOPICAL_OIL | Status: DC | PRN

## 2018-07-08 MED ORDER — BENZOCAINE-MENTHOL 20-0.5 % EX AERO: 1.0000 "application " | INHALATION_SPRAY | CUTANEOUS | Status: DC | PRN

## 2018-07-08 MED ORDER — LACTATED RINGERS IV SOLN
500.0000 mL | INTRAVENOUS | Status: DC | PRN
  Administered 2018-07-08: 1000 mL via INTRAVENOUS

## 2018-07-08 MED ORDER — ONDANSETRON HCL 4 MG/2ML IJ SOLN: 4.0000 mg | INTRAMUSCULAR | Status: DC | PRN

## 2018-07-08 MED ORDER — ZOLPIDEM TARTRATE 5 MG PO TABS: 5.0000 mg | ORAL_TABLET | Freq: Every evening | ORAL | Status: DC | PRN

## 2018-07-08 MED ORDER — DIPHENHYDRAMINE HCL 25 MG PO CAPS: 25.0000 mg | ORAL_CAPSULE | Freq: Four times a day (QID) | ORAL | Status: DC | PRN

## 2018-07-08 MED ORDER — LACTATED RINGERS IV SOLN: 500.0000 mL | Freq: Once | INTRAVENOUS | Status: DC

## 2018-07-08 MED ORDER — DIPHENHYDRAMINE HCL 50 MG/ML IJ SOLN: 12.5000 mg | INTRAMUSCULAR | Status: DC | PRN

## 2018-07-08 MED ORDER — FENTANYL CITRATE (PF) 100 MCG/2ML IJ SOLN
50.0000 ug | INTRAMUSCULAR | Status: DC | PRN
  Administered 2018-07-08 (×2): 100 ug via INTRAVENOUS
  Filled 2018-07-08 (×2): qty 2

## 2018-07-08 MED ORDER — PRENATAL MULTIVITAMIN CH
1.0000 | ORAL_TABLET | Freq: Every day | ORAL | Status: DC
  Administered 2018-07-09 – 2018-07-10 (×2): 1 via ORAL
  Filled 2018-07-08 (×2): qty 1

## 2018-07-08 MED ORDER — PHENYLEPHRINE 40 MCG/ML (10ML) SYRINGE FOR IV PUSH (FOR BLOOD PRESSURE SUPPORT)
80.0000 ug | PREFILLED_SYRINGE | INTRAVENOUS | Status: DC | PRN
  Administered 2018-07-08: 80 ug via INTRAVENOUS
  Filled 2018-07-08: qty 5

## 2018-07-08 MED ORDER — ONDANSETRON HCL 4 MG/2ML IJ SOLN: 4.0000 mg | Freq: Four times a day (QID) | INTRAMUSCULAR | Status: DC | PRN

## 2018-07-08 MED ORDER — LIDOCAINE HCL (PF) 1 % IJ SOLN
INTRAMUSCULAR | Status: DC | PRN
  Administered 2018-07-08 (×2): 4 mL via EPIDURAL

## 2018-07-08 MED ORDER — SENNOSIDES-DOCUSATE SODIUM 8.6-50 MG PO TABS
2.0000 | ORAL_TABLET | ORAL | Status: DC
  Administered 2018-07-09 (×2): 2 via ORAL
  Filled 2018-07-08 (×2): qty 2

## 2018-07-08 MED ORDER — LACTATED RINGERS IV BOLUS
1000.0000 mL | Freq: Once | INTRAVENOUS | Status: AC
  Administered 2018-07-08: 1000 mL via INTRAVENOUS

## 2018-07-08 MED ORDER — ACETAMINOPHEN 325 MG PO TABS
650.0000 mg | ORAL_TABLET | ORAL | Status: DC | PRN
  Administered 2018-07-09 – 2018-07-10 (×3): 650 mg via ORAL
  Filled 2018-07-08 (×3): qty 2

## 2018-07-08 MED ORDER — SIMETHICONE 80 MG PO CHEW: 80.0000 mg | CHEWABLE_TABLET | ORAL | Status: DC | PRN

## 2018-07-08 MED ORDER — WITCH HAZEL-GLYCERIN EX PADS: 1.0000 "application " | MEDICATED_PAD | CUTANEOUS | Status: DC | PRN

## 2018-07-08 MED ORDER — OXYCODONE-ACETAMINOPHEN 5-325 MG PO TABS: 1.0000 | ORAL_TABLET | ORAL | Status: DC | PRN

## 2018-07-08 MED ORDER — OXYTOCIN 40 UNITS IN LACTATED RINGERS INFUSION - SIMPLE MED
2.5000 [IU]/h | INTRAVENOUS | Status: DC
  Administered 2018-07-08: 2.5 [IU]/h via INTRAVENOUS
  Filled 2018-07-08: qty 1000

## 2018-07-08 MED ORDER — FENTANYL 2.5 MCG/ML BUPIVACAINE 1/10 % EPIDURAL INFUSION (WH - ANES)
14.0000 mL/h | INTRAMUSCULAR | Status: DC | PRN
  Administered 2018-07-08: 14 mL/h via EPIDURAL
  Filled 2018-07-08: qty 100

## 2018-07-08 MED ORDER — LACTATED RINGERS IV SOLN
INTRAVENOUS | Status: DC
  Administered 2018-07-08: 16:00:00 via INTRAVENOUS

## 2018-07-08 MED ORDER — MISOPROSTOL 50MCG HALF TABLET: 50.0000 ug | ORAL_TABLET | Freq: Once | ORAL | Status: DC

## 2018-07-08 MED ORDER — TETANUS-DIPHTH-ACELL PERTUSSIS 5-2.5-18.5 LF-MCG/0.5 IM SUSP: 0.5000 mL | Freq: Once | INTRAMUSCULAR | Status: DC

## 2018-07-08 MED ORDER — LIDOCAINE HCL (PF) 1 % IJ SOLN
30.0000 mL | INTRAMUSCULAR | Status: DC | PRN
  Filled 2018-07-08: qty 30

## 2018-07-08 NOTE — Progress Notes (Signed)
History   [id number]   Chief Complaint  Patient presents with  . Rupture of Membranes  . Contractions    HPI Michelle [name] is a 32 y.o. female  G3P2002 @38 .2 wks here with report of leaking clear fluid x2 days.  Leaking of fluid has continued. Pt reports frequent contractions today. She denies vaginal bleeding. She reports good fetal movement. All other systems negative.    Patient's last menstrual period was 10/31/2017 (approximate).  OB History  Gravida Para Term Preterm AB Living  3 2 2  0 0 2  SAB TAB Ectopic Multiple Live Births  0 0 0 0 2    # Outcome Date GA Lbr Len/2nd Weight Sex Delivery Anes PTL Lv  3 Current           2 Term 05/02/11 [redacted]w[redacted]d 12:19 / 00:05 2475 g F Vag-Spont EPI  LIV  1 Term 03/07/08 [redacted]w[redacted]d  3147 g M Vag-Spont   LIV    Past Medical History:  Diagnosis Date  . Anemia   . Chlamydia   . Insomnia     No family history on file.  Social History   Socioeconomic History  . Marital status: Single    Spouse name: Not on file  . Number of children: Not on file  . Years of education: Not on file  . Highest education level: Not on file  Occupational History  . Not on file  Social Needs  . Financial resource strain: Not on file  . Food insecurity:    Worry: Not on file    Inability: Not on file  . Transportation needs:    Medical: Not on file    Non-medical: Not on file  Tobacco Use  . Smoking status: Former Games developer  . Smokeless tobacco: Never Used  Substance and Sexual Activity  . Alcohol use: No  . Drug use: Not Currently    Types: Marijuana    Comment: before pregnancy  . Sexual activity: Yes  Lifestyle  . Physical activity:    Days per week: Not on file    Minutes per session: Not on file  . Stress: Not on file  Relationships  . Social connections:    Talks on phone: Not on file    Gets together: Not on file    Attends religious service: Not on file    Active member of club or organization: Not on file    Attends meetings  of clubs or organizations: Not on file    Relationship status: Not on file  Other Topics Concern  . Not on file  Social History Narrative  . Not on file    Allergies  Allergen Reactions  . Latex Itching    No current facility-administered medications on file prior to encounter.    Current Outpatient Medications on File Prior to Encounter  Medication Sig Dispense Refill  . acetaminophen (TYLENOL) 325 MG tablet Take 650 mg by mouth every 6 (six) hours as needed.    . famotidine (PEPCID) 20 MG tablet Take 40 mg by mouth as needed. For heart burn     . ferrous sulfate 325 (65 FE) MG EC tablet Take 325 mg by mouth daily.      . prenatal vitamin w/FE, FA (PRENATAL 1 + 1) 27-1 MG TABS Take 1 tablet by mouth daily. 30 each 3  . zolpidem (AMBIEN) 5 MG tablet Take 1 tablet (5 mg total) by mouth at bedtime as needed for sleep. 30 tablet 1  Review of Systems  Gastrointestinal: Positive for abdominal pain.  Genitourinary: Positive for vaginal discharge. Negative for vaginal bleeding.     Physical Exam   Vitals:   07/08/18 1334  BP: 116/72  Pulse: 82  Resp: 19  Temp: 99 F (37.2 C)  SpO2: 99%  Weight: 79.8 kg  Height: 5\' 4"  (1.626 m)    Physical Exam  Constitutional: She is oriented to person, place, and time. She appears well-developed and well-nourished. No distress.  HENT:  Head: Normocephalic and atraumatic.  Neck: Normal range of motion.  Cardiovascular: Normal rate.  Respiratory: Effort normal. No respiratory distress.  Genitourinary:  Genitourinary Comments: SSE: +pool, fern pos SVE 2.5/80 per RN  Musculoskeletal: Normal range of motion.  Neurological: She is alert and oriented to person, place, and time.  Skin: Skin is warm and dry.  Psychiatric: She has a normal mood and affect.  EFM: 135 bpm, mod variability, no accels, variable decels Toco: irregular  MAU Course  Procedures  MDM PROM confirmed, variables with ctx, IVF started, pt repositioned lateral.  Will admit. Labor team notified.  Assessment and Plan  [redacted] weeks gestation [facility]OM [facility] team   Donette LarryBhambri, Pattiann Solanki, [place]M 07/08/2018 2:12 PM

## 2018-07-08 NOTE — Anesthesia Preprocedure Evaluation (Signed)
Anesthesia Evaluation  Patient identified by MRN, date of birth, ID band Patient awake    Reviewed: Allergy & Precautions, Patient's Chart, lab work & pertinent test results  Airway Mallampati: II  TM Distance: >3 FB Neck ROM: Full    Dental  (+) Dental Advisory Given, Poor Dentition   Pulmonary former smoker,    Pulmonary exam normal breath sounds clear to auscultation       Cardiovascular negative cardio ROS Normal cardiovascular exam Rhythm:Regular Rate:Normal     Neuro/Psych negative neurological ROS  negative psych ROS   GI/Hepatic negative GI ROS, (+)     substance abuse  marijuana use,   Endo/Other  negative endocrine ROS  Renal/GU negative Renal ROS  negative genitourinary   Musculoskeletal negative musculoskeletal ROS (+)   Abdominal   Peds negative pediatric ROS (+)  Hematology  (+) anemia ,   Anesthesia Other Findings   Reproductive/Obstetrics (+) Pregnancy                             Anesthesia Physical Anesthesia Plan  ASA: II  Anesthesia Plan: Epidural   Post-op Pain Management:    Induction:   PONV Risk Score and Plan: Treatment may vary due to age or medical condition  Airway Management Planned: Natural Airway  Additional Equipment:   Intra-op Plan:   Post-operative Plan:   Informed Consent: I have reviewed the patients History and Physical, chart, labs and discussed the procedure including the risks, benefits and alternatives for the proposed anesthesia with the patient or authorized representative who has indicated his/her understanding and acceptance.     Plan Discussed with: CRNA  Anesthesia Plan Comments:         Anesthesia Quick Evaluation

## 2018-07-08 NOTE — MAU Note (Signed)
Pt reports leaking fluid x 2 days, contractions q 5 minutes

## 2018-07-08 NOTE — Anesthesia Procedure Notes (Signed)
Epidural Patient location during procedure: OB Start time: 07/08/2018 6:55 PM End time: 07/08/2018 6:58 PM  Staffing Anesthesiologist: Kaylyn LayerHowze, Jaedan Schuman E, MD Performed: anesthesiologist   Preanesthetic Checklist Completed: patient identified, pre-op evaluation, timeout performed, IV checked, risks and benefits discussed and monitors and equipment checked  Epidural Patient position: sitting Prep: site prepped and draped and DuraPrep Patient monitoring: continuous pulse ox, blood pressure, heart rate and cardiac monitor Approach: midline Location: L3-L4 Injection technique: LOR air  Needle:  Needle type: Tuohy  Needle gauge: 17 G Needle length: 9 cm Needle insertion depth: 6 cm Catheter type: closed end flexible Catheter size: 19 Gauge Catheter at skin depth: 11 cm Test dose: negative and Other (1% lidocaine)  Assessment Events: blood not aspirated, injection not painful, no injection resistance, negative IV test and no paresthesia  Additional Notes Patient identified. Risks, benefits, and alternatives discussed with patient including but not limited to bleeding, infection, nerve damage, paralysis, failed block, incomplete pain control, headache, blood pressure changes, nausea, vomiting, reactions to medication, itching, and postpartum back pain. Confirmed with bedside nurse the patient's most recent platelet count. Confirmed with patient that they are not currently taking any anticoagulation, have any bleeding history, or any family history of bleeding disorders. Patient expressed understanding and wished to proceed. All questions were answered. Sterile technique was used throughout the entire procedure. Please see nursing notes for vital signs. Crisp LOR on first pass. Test dose was given through epidural catheter and negative prior to continuing to dose epidural or start infusion. Warning signs of high block given to the patient including shortness of breath, tingling/numbness in  hands, complete motor block, or any concerning symptoms with instructions to call for help. Patient was given instructions on fall risk and not to get out of bed. All questions and concerns addressed with instructions to call with any issues or inadequate analgesia.  Reason for block:procedure for pain

## 2018-07-08 NOTE — Anesthesia Pain Management Evaluation Note (Signed)
  CRNA Pain Management Visit Note  Patient: Michelle [name], 32 y.o., female  "Hello I am a member of the anesthesia team at [facility]. We have an anesthesia team available at all times to provide care throughout the hospital, including epidural management and anesthesia for C-section. I don't know your plan for the delivery whether it a natural birth, water birth, IV sedation, nitrous supplementation, doula or epidural, but we want to meet your pain goals."   1.Was your pain managed to your expectations on prior hospitalizations?   Yes   2.What is your expectation for pain management during this hospitalization?     Epidural  3.How can we help you reach that goal?   Record the patient's initial score and the patient's pain goal.   Pain: 7  Pain Goal: 3 [facility] wants you to be able to say your pain was always managed very well.  Laban EmperorMalinova,Michelle [name] 07/08/2018

## 2018-07-08 NOTE — H&P (Addendum)
Michelle [name] is a [id number] 32 y.o. female from [facility] presenting for SOL w SROM x2 days. OB History    Gravida  3   Para  2   Term  2   Preterm  0   AB  0   Living  2     SAB  0   TAB  0   Ectopic  0   Multiple  0   Live Births  2          Past Medical History:  Diagnosis Date  . Anemia    On Iron Supplements  . Anxiety   . Chlamydia   . Insomnia    Past Surgical History:  Procedure Laterality Date  . EYE SURGERY Left    Family History: family history is not on file. Social History:  reports that she has quit smoking. She has never used smokeless tobacco. She reports that she has current or past drug history. Drug: Marijuana. She reports that she does not drink alcohol.     Maternal Diabetes: No Genetic Screening: Normal Maternal Ultrasounds/Referrals: Normal Fetal Ultrasounds or other Referrals:  None Maternal Substance Abuse:  Yes:  Type: Marijuana Significant Maternal Medications:  None Significant Maternal Lab Results:  None Other Comments:  None  Review of Systems  Constitutional: Negative.   HENT: Negative.   Eyes: Negative.   Respiratory: Negative.   Cardiovascular: Negative.   Gastrointestinal: Negative.   Genitourinary: Negative.   Musculoskeletal: Negative.   Skin: Negative.   Neurological: Negative.   Endo/Heme/Allergies: Negative.   Psychiatric/Behavioral: Negative.   All other systems reviewed and are negative.  Maternal Medical History:  Reason for admission: Rupture of membranes.   Contractions: Onset was yesterday.   Frequency: irregular.   Perceived severity is moderate.    Fetal activity: Perceived fetal activity is normal.   Last perceived fetal movement was within the past hour.    Prenatal complications: no prenatal complications Prenatal Complications - Diabetes: none.    Dilation: 2.5 Effacement (%): 80 Station: -2 Exam by:: Weston,RN Blood pressure 118/71, pulse 80, temperature 98.5 F (36.9 C),  temperature source Oral, resp. rate 16, height 5\' 4"  (1.626 m), weight 79.8 kg, last menstrual period 10/31/2017, SpO2 99 %, unknown if currently breastfeeding. Maternal Exam:  Uterine Assessment: Contraction strength is moderate.  Contraction frequency is irregular.   Abdomen: Patient reports no abdominal tenderness. Fetal presentation: vertex     Physical Exam  Nursing note and vitals reviewed. Constitutional: She is oriented to person, place, and time. She appears well-developed and well-nourished.  HENT:  Head: Normocephalic.  Mouth/Throat: Dental caries (with multiple missing teeth) present.  Neck: Normal range of motion.  Cardiovascular: Normal rate, regular rhythm and normal heart sounds.  Respiratory: Effort normal and breath sounds normal.  GI: Soft. Bowel sounds are normal.  Musculoskeletal: Normal range of motion.  Neurological: She is alert and oriented to person, place, and time.  Skin: Skin is warm and dry.  Psychiatric: She has a normal mood and affect. Her behavior is normal. Judgment and thought content normal.    Prenatal labs: ABO, Rh: --/--/O POS (11/26 1353) Antibody: PENDING (11/26 1353) Rubella: Immune (08/01 0000) RPR: Nonreactive (08/01 0000)  HBsAg: Negative (08/01 0000)  HIV: Non Reactive (10/15 1127)  GBS:   Neg   Assessment/Plan: SOL with SROM x2 days   Bernerd LimboJamilla R Walker, SNM 07/08/2018, 3:29 PM  Urine drug screen ordered Category II fetal tracing since admission Baseline: 130, moderate variability, positive accels,  rare variable decelerations Toco: irregular contractions q 2-6 min   Girl/formula/considering Nexplanon  I confirm that I have verified the information documented in the SNM's note and that I have also personally observed the student conducting the physical exam and all medical decision making activities.  Clayton Bibles, CNM 07/08/18  3:51 PM

## 2018-07-09 ENCOUNTER — Encounter (HOSPITAL_COMMUNITY): Payer: Self-pay

## 2018-07-09 ENCOUNTER — Encounter: Payer: Self-pay | Admitting: Obstetrics and Gynecology

## 2018-07-09 ENCOUNTER — Other Ambulatory Visit: Payer: Self-pay

## 2018-07-09 LAB — CBC
HCT: 31.6 % — ABNORMAL LOW (ref 36.0–46.0)
Hemoglobin: 10.3 g/dL — ABNORMAL LOW (ref 12.0–15.0)
MCH: 27.3 pg (ref 26.0–34.0)
MCHC: 32.6 g/dL (ref 30.0–36.0)
MCV: 83.8 fL (ref 80.0–100.0)
NRBC: 0 % (ref 0.0–0.2)
PLATELETS: 273 10*3/uL (ref 150–400)
RBC: 3.77 MIL/uL — ABNORMAL LOW (ref 3.87–5.11)
RDW: 16.1 % — AB (ref 11.5–15.5)
WBC: 16.3 10*3/uL — ABNORMAL HIGH (ref 4.0–10.5)

## 2018-07-09 LAB — RPR: RPR Ser Ql: NONREACTIVE

## 2018-07-09 NOTE — Progress Notes (Signed)
POSTPARTUM PROGRESS NOTE  Post Partum Day 1  Subjective:  Michelle [name] is a [age] y.o. [id number] s/p SVD at 2559w2d.  She reports she is doing well. No acute events overnight. She denies any problems with ambulating or po intake. Denies nausea or vomiting.  Pain is well controlled. Lochia is improved. She has not had a BM or flatus.  Objective: Blood pressure (!) 102/56, pulse 61, temperature 98.4 F (36.9 C), temperature source Oral, resp. rate 18, height 5\' 4"  (1.626 m), weight 79.8 kg, last menstrual period 10/31/2017, SpO2 100 %, unknown if currently breastfeeding.  Physical Exam:  General: alert, cooperative and no distress Chest: no respiratory distress Heart:regular rate, distal pulses intact Abdomen: soft, nontender Uterine Fundus: firm, appropriately tender DVT Evaluation: No calf swelling or tenderness Extremities: No edema Skin: warm, dry  Recent Labs    07/08/18 2000 07/09/18 0543  HGB 12.0 10.3*  HCT 37.5 31.6*    Assessment/Plan: Michelle [name] is a 32 y.o. [phone] s/p SVD at 2959w2d   PPD# 1 - Doing well  Routine postpartum care Contraception: Nexplanon Feeding: Breast Positive THC: Needs SW consult Dispo: Plan for likely discharge tomorrow   LOS: 1 day   Attestation: I have seen this patient and agree with the medical student's documentation. I have examined them separately, and we have discussed the plan of care. Amenable to seeing social work. States pain well-controlled.   Cristal DeerLaurel S. Earlene PlaterWallace, DO OB/GYN Fellow

## 2018-07-09 NOTE — Lactation Note (Signed)
This note was copied from a baby's chart. Lactation Consultation Note  Patient Name: Girl Lucia EstelleBrittany Vanhecke Today's Date: 07/09/2018  [facility]    Maternal Data    Feeding Feeding Type: Bottle Fed - Formula Nipple Type: Slow - flow  LATCH Score Latch: Too sleepy or reluctant, no latch achieved, no sucking elicited.                 Interventions    Lactation Tools Discussed/Used     Consult Status      Danelle EarthlyRobin Garyn Arlotta 07/09/2018, 3:26 AM

## 2018-07-09 NOTE — Clinical Social Work Maternal (Signed)
CLINICAL SOCIAL WORK MATERNAL/CHILD NOTE  Patient Details  Name: Michelle [name] MRN: [id number] Date of Birth: 12/28/1985  Date:  07/09/2018  Clinical Social Worker Initiating Note:  Virlee Stroschein, LCSWA   Date/Time: Initiated:  07/09/18/1428             Child's Name:  Michelle [name]   Biological Parents:  Mother, Father(Father - Issac Glover)   Need for Interpreter:  None   Reason for Referral:  Current Substance Use/Substance Use During Pregnancy    Address:  [address] [place] [address]    Phone number:  [phone] (home)     Additional phone number:   Household Members/Support Persons (HM/SP):   Household Member/Support Person 1, Household Member/Support Person 2   HM/SP Name Relationship DOB or Age  HM/SP -1 Patient declined to provide Son  Patient declined to provide  HM/SP -2 Patient declined to provide Daughter  Patient declined to provide  HM/SP -3     HM/SP -4     HM/SP -5     HM/SP -6     HM/SP -7     HM/SP -8       Natural Supports (not living in the home): Extended Family   Professional Supports:(unknown)   Employment:Unemployed   Type of Work:     Education:  Some College   Homebound arranged:    Financial Resources:Medicaid   Other Resources: Food Stamps    Cultural/Religious Considerations Which May Impact Care:   Strengths: Ability to meet basic needs , Pediatrician chosen, Home prepared for child    Psychotropic Medications:         Pediatrician:    [place] area  Pediatrician List:   [facility] ([address])  [place]   [place]   [place]   [place]   [place]     Pediatrician Fax Number:    Risk Factors/Current Problems: Substance Use    Cognitive State: Able to Concentrate , Alert , Goal Oriented    Mood/Affect: Apprehensive , Constricted , Agitated    CSW  Assessment:CSW spoke with MOB at bedside regarding consult for substance use. MOB was accompanied by her brother and 2 children, CSW agreed to come back later. MOB reported that she wanted to get it over with and granted CSW verbal permission to speak in front of her family.   CSW introduced self and reason for consult. CSW informed MOB about hospital drug policy and inquired about MOB's substance use during pregnancy. MOB reported that she just smoked a little, noting it's been more than a week ago. CSW asked MOB if she had any history with CPS, MOB reported that she has no history with CPS. CSW asked MOB for the names of her other children, MOB declined to provide. CSW asked MOB if she had the necessary items needed to care for baby, MOB replied yes. CSW asked MOB if she needed any resources, MOB replied no. CSW asked MOB about her support system, MOB reported she has her family and FOB. MOB reported that she just she and her kids live in the home. CSW inquired about MOB's mental health history, MOB reported none.   CSW provided education regarding the baby blues period vs. perinatal mood disorders, discussed treatment and gave resources for mental health follow up if concerns arise.  CSW recommends self-evaluation during the postpartum time period using the New Mom Checklist from Postpartum Progress and encouraged MOB to contact   a medical professional if symptoms are noted at any time.    CSW provided review of Sudden Infant Death Syndrome (SIDS) precautions.    CSW assessed for safety, MOB denied SI, HI and domestic violence. MOB was very short with CSW and had minimal speech. MOB appeared to be agitated during assessment and declined to answer certain questions. CSW will continue to monitor baby's UDS and CDS and will make a report to CPS if warranted.   CSW identifies no further need for intervention and no barriers to discharge at this time.  CSW Plan/Description: CSW Will Continue to  Monitor Umbilical Cord Tissue Drug Screen Results and Make Report if Warranted, Perinatal Mood and Anxiety Disorder (PMADs) Education, Sudden Infant Death Syndrome (SIDS) Education, Hospital Drug Screen Policy Information, No Further Intervention Required/No Barriers to Discharge    Esli Jernigan L Annabell Oconnor, LCSW 07/09/2018, 2:33 PM           

## 2018-07-09 NOTE — Progress Notes (Signed)
Found patient sitting on floor in hall crying. Sat down to talk with her. Patient states she doesn't understand why she can't take her baby home tomorrow. States she is a good mom and that she should be able to take her baby home even though she is little. Expressed concern for her other 2 children who her mother is caring for. States she is clostrophobic and can't stand to be in her room. Her brother is in her room now with all 3 of her children. Explained concern for babies with low birth weight and after rupture of membranes for 46 hr. Patient cried some more and still expressed frustration. Support given. Patient calmed and went back into her room.

## 2018-07-09 NOTE — Anesthesia Postprocedure Evaluation (Signed)
Anesthesia Post Note  Patient: Jacquelyne BalintBrittany S Teagarden  Procedure(s) Performed: AN AD HOC LABOR EPIDURAL     Patient location during evaluation: Mother Baby Anesthesia Type: Epidural Level of consciousness: awake and alert Pain management: pain level controlled Vital Signs Assessment: post-procedure vital signs reviewed and stable Respiratory status: spontaneous breathing, nonlabored ventilation and respiratory function stable Cardiovascular status: stable Postop Assessment: no headache, no backache, epidural receding, able to ambulate, adequate PO intake, no apparent nausea or vomiting and patient able to bend at knees Anesthetic complications: no    Last Vitals:  Vitals:   07/09/18 0253 07/09/18 0600  BP: 99/88 (!) 102/56  Pulse: 65 61  Resp: 18 18  Temp: 36.8 C 36.9 C  SpO2: 100% 100%    Last Pain:  Vitals:   07/09/18 0600  TempSrc: Oral  PainSc:    Pain Goal: Patients Stated Pain Goal: 10 (07/08/18 1529)               Camilia Caywood Hristova

## 2018-07-10 ENCOUNTER — Ambulatory Visit: Payer: Self-pay

## 2018-07-10 MED ORDER — IBUPROFEN 600 MG PO TABS: 600.0000 mg | ORAL_TABLET | Freq: Four times a day (QID) | ORAL | 0 refills | Status: DC

## 2018-07-10 NOTE — Lactation Note (Signed)
This note was copied from a baby's chart. Lactation Consultation Note  Patient Name: Michelle Lucia EstelleBrittany Stairs Today's Date: 07/10/2018 Reason for consult: Initial assessment;Early term 37-38.6wks;Infant < 6lbs   P3, 48 hours Baby < 5 lbs. 8018w2d Mother breastfed her other children for 1 year each. Mother giving baby bottle of 22 cal neosure upon entering. Mother states baby has been sleepy at the breast today verus yesterday.  Provided education. Reviewed LPI feeding plan volume guidelines of 20-30 ml, more if desired.  Baby took 30 ml. Set up DEBP. Recommend mother post pump 4-6 times per day for 10-20 min with DEBP on initiation setting and q 3 hours if baby is not latching.  Demonstrated use of pump for fit. Give baby back volume pumped at the next feeding. Reviewed cleaning and milk storage.  Mother has 2 older children in room alone and hopes to pump once children fall asleep. Mom encouraged to feed baby 8-12 times/24 hours and with feeding cues at least q 3 hours. Provided mother with lactation brochure and services.        Maternal Data Has patient been taught Hand Expression?: Yes Does the patient have breastfeeding experience prior to this delivery?: Yes  Feeding Feeding Type: Bottle Fed - Formula  LATCH Score                   Interventions Interventions: DEBP  Lactation Tools Discussed/Used Pump Review: Setup, frequency, and cleaning;Milk Storage Initiated by:: Dahlia Byesuth Tauren Delbuono RN IBCLC Date initiated:: 07/10/18   Consult Status Consult Status: Follow-up Date: 07/11/18 Follow-up type: In-patient    Dahlia ByesBerkelhammer, Amed Datta [facility] 07/10/2018, 8:26 PM

## 2018-07-10 NOTE — Discharge Instructions (Signed)
Vaginal Delivery, Care After °Refer to this sheet in the next few weeks. These instructions provide you with information about caring for yourself after vaginal delivery. Your health care provider may also give you more specific instructions. Your treatment has been planned according to current medical practices, but problems sometimes occur. Call your health care provider if you have any problems or questions. °What can I expect after the procedure? °After vaginal delivery, it is common to have: °· Some bleeding from your vagina. °· Soreness in your abdomen, your vagina, and the area of skin between your vaginal opening and your anus (perineum). °· Pelvic cramps. °· Fatigue. ° °Follow these instructions at home: °Medicines °· Take over-the-counter and prescription medicines only as told by your health care provider. °· If you were prescribed an antibiotic medicine, take it as told by your health care provider. Do not stop taking the antibiotic until it is finished. °Driving ° °· Do not drive or operate heavy machinery while taking prescription pain medicine. °· Do not drive for 24 hours if you received a sedative. °Lifestyle °· Do not drink alcohol. This is especially important if you are breastfeeding or taking medicine to relieve pain. °· Do not use tobacco products, including cigarettes, chewing tobacco, or e-cigarettes. If you need help quitting, ask your health care provider. °Eating and drinking °· Drink at least 8 eight-ounce glasses of water every day unless you are told not to by your health care provider. If you choose to breastfeed your baby, you may need to drink more water than this. °· Eat high-fiber foods every day. These foods may help prevent or relieve constipation. High-fiber foods include: °? Whole grain cereals and breads. °? Brown rice. °? Beans. °? Fresh fruits and vegetables. °Activity °· Return to your normal activities as told by your health care provider. Ask your health care provider  what activities are safe for you. °· Rest as much as possible. Try to rest or take a nap when your baby is sleeping. °· Do not lift anything that is heavier than your baby or 10 lb (4.5 kg) until your health care provider says that it is safe. °· Talk with your health care provider about when you can engage in sexual activity. This may depend on your: °? Risk of infection. °? Rate of healing. °? Comfort and desire to engage in sexual activity. °Vaginal Care °· If you have an episiotomy or a vaginal tear, check the area every day for signs of infection. Check for: °? More redness, swelling, or pain. °? More fluid or blood. °? Warmth. °? Pus or a bad smell. °· Do not use tampons or douches until your health care provider says this is safe. °· Watch for any blood clots that may pass from your vagina. These may look like clumps of dark red, brown, or black discharge. °General instructions °· Keep your perineum clean and dry as told by your health care provider. °· Wear loose, comfortable clothing. °· Wipe from front to back when you use the toilet. °· Ask your health care provider if you can shower or take a bath. If you had an episiotomy or a perineal tear during labor and delivery, your health care provider may tell you not to take baths for a certain length of time. °· Wear a bra that supports your breasts and fits you well. °· If possible, have someone help you with household activities and help care for your baby for at least a few days after   you leave the hospital. °· Keep all follow-up visits for you and your baby as told by your health care provider. This is important. °Contact a health care provider if: °· You have: °? Vaginal discharge that has a bad smell. °? Difficulty urinating. °? Pain when urinating. °? A sudden increase or decrease in the frequency of your bowel movements. °? More redness, swelling, or pain around your episiotomy or vaginal tear. °? More fluid or blood coming from your episiotomy or  vaginal tear. °? Pus or a bad smell coming from your episiotomy or vaginal tear. °? A fever. °? A rash. °? Little or no interest in activities you used to enjoy. °? Questions about caring for yourself or your baby. °· Your episiotomy or vaginal tear feels warm to the touch. °· Your episiotomy or vaginal tear is separating or does not appear to be healing. °· Your breasts are painful, hard, or turn red. °· You feel unusually sad or worried. °· You feel nauseous or you vomit. °· You pass large blood clots from your vagina. If you pass a blood clot from your vagina, save it to show to your health care provider. Do not flush blood clots down the toilet without having your health care provider look at them. °· You urinate more than usual. °· You are dizzy or light-headed. °· You have not breastfed at all and you have not had a menstrual period for 12 weeks after delivery. °· You have stopped breastfeeding and you have not had a menstrual period for 12 weeks after you stopped breastfeeding. °Get help right away if: °· You have: °? Pain that does not go away or does not get better with medicine. °? Chest pain. °? Difficulty breathing. °? Blurred vision or spots in your vision. °? Thoughts about hurting yourself or your baby. °· You develop pain in your abdomen or in one of your legs. °· You develop a severe headache. °· You faint. °· You bleed from your vagina so much that you fill two sanitary pads in one hour. °This information is not intended to replace advice given to you by your health care provider. Make sure you discuss any questions you have with your health care provider. °Document Released: 07/27/2000 Document Revised: 01/11/2016 Document Reviewed: 08/14/2015 °Elsevier Interactive Patient Education © 2018 [employer] ° °

## 2018-07-10 NOTE — Discharge Summary (Addendum)
Postpartum Discharge Summary     Patient Name: Michelle [name] DOB: 1986-04-26 MRN: [id number]  Date of admission: 07/08/2018 Delivering Provider: Calvert Cantor   Date of discharge: 07/10/2018  Admitting diagnosis: 38wks ctx leaking  Intrauterine pregnancy: [redacted]w[redacted]d     Secondary diagnosis:  Active Problems:   Pregnancy  Additional problems: [facility] 11/26     Discharge diagnosis: Term Pregnancy Delivered                                                                                                Post partum procedures:none  Augmentation: none  Complications: None  Hospital course:  Onset of Labor With Vaginal Delivery     32 y.o. yo G3P3003 at [redacted]w[redacted]d was admitted in Active Labor on 07/08/2018. Patient had an uncomplicated labor course as follows:  Membrane Rupture Time/Date: 9:30 PM ,07/06/2018   Intrapartum Procedures: Episiotomy: None [1]                                         Lacerations:  None [1]  Patient had a delivery of a Viable infant. 07/08/2018  Information for the patient's newborn:  Jamielyn, Petrucci [[id number]]  Delivery Method: Vaginal, Spontaneous(Filed from Delivery Summary)    Pateint had an uncomplicated postpartum course.  She is ambulating, tolerating a regular diet, passing flatus, and urinating well. Patient is discharged home in stable condition on 07/10/18.   Magnesium Sulfate recieved: No BMZ received: No  Physical exam  Vitals:   07/09/18 0600 07/09/18 1357 07/09/18 2250 07/10/18 0528  BP: (!) 102/56 104/61 115/63 111/63  Pulse: 61 60 65 64  Resp: 18  18 18   Temp: 98.4 F (36.9 C) 98.8 F (37.1 C) 97.8 F (36.6 C) 98.2 F (36.8 C)  TempSrc: Oral Oral Oral Axillary  SpO2: 100% 100%    Weight:      Height:       General: alert Lochia: appropriate Uterine Fundus: firm Incision: N/A DVT Evaluation: No evidence of DVT seen on physical exam. Labs: Lab Results  Component Value Date   WBC 16.3 (H) 07/09/2018   HGB  10.3 (L) 07/09/2018   HCT 31.6 (L) 07/09/2018   MCV 83.8 07/09/2018   PLT 273 07/09/2018   No flowsheet data found.  Discharge instruction: per After Visit Summary and "Baby and Me Booklet".  After visit meds:  Allergies as of 07/10/2018      Reactions   Latex Itching      Medication List    STOP taking these medications   ferrous sulfate 325 (65 FE) MG EC tablet     TAKE these medications   famotidine 20 MG tablet Commonly known as:  PEPCID Take 40 mg by mouth as needed. For heart burn   ibuprofen 600 MG tablet Commonly known as:  ADVIL,MOTRIN Take 1 tablet (600 mg total) by mouth every 6 (six) hours.   prenatal vitamin w/FE, FA 27-1 MG Tabs tablet Take 1 tablet by mouth daily.  TYLENOL 325 MG tablet Generic drug:  acetaminophen Take 650 mg by mouth every 6 (six) hours as needed for mild pain.   zolpidem 5 MG tablet Commonly known as:  AMBIEN Take 1 tablet (5 mg total) by mouth at bedtime as needed for sleep.       Diet: routine diet  Activity: Advance as tolerated. Pelvic rest for 6 weeks.   Outpatient follow up:4 weeks Follow up Appt:No future appointments. Follow up Visit:   Please schedule this patient for Postpartum visit in: 4 weeks with the following provider: MD For C/S patients schedule nurse incision check in weeks 2 weeks: no Low risk [facility] Delivery mode:  SVD Anticipated Birth Control:  Nexplanon PP Procedures needed: none  Schedule Integrated BH visit: no   Newborn Data: Live born female  Birth Weight: 4 lb 11.3 oz (2135 g) APGAR: 8, 9  Newborn Delivery   Birth date/time:  07/08/2018 19:40:00 Delivery type:  Vaginal, Spontaneous     Baby Feeding: Bottle Disposition:home with mother   07/10/2018 Leeroy Bockhelsey L Anderson, DO  OB FELLOW DISCHARGE ATTESTATION  I have seen and examined this patient and agree with above documentation in the resident's note.   Gwenevere AbbotNimeka Zael Shuman, MD OB Fellow  07/10/2018,  9:39 AM

## 2018-07-10 NOTE — Plan of Care (Signed)
Progressing appropriately. Encouraged to call for assistance as needed, and for LATCH assessment.  

## 2018-07-11 ENCOUNTER — Ambulatory Visit: Payer: Self-pay

## 2018-07-11 NOTE — Lactation Note (Signed)
This note was copied from a baby's chart. Lactation Consultation Note  Patient Name: Michelle [name] Today's Date: 07/11/2018 Reason for consult: Follow-up assessment;Infant < 6lbs;Early term 4737-38.6wks  Visited with P3 Mom of ET infant at 6764 hrs old, baby at 3% weight loss.   Mom states she has been putting baby to the breast for 5-15 minutes and then feeding baby 22 cal formula.  Mom aware of importance of >20-30 ml supplement at least every 3 hrs.  DEBP set up at bedside, but Mom hasn't used it.  Talked about importance of pumping regularly to stimulate her milk supply.   [facility], Mom declined getting a pump.  Reassembled pump parts to demonstrate a manual double pump.  FOB present and encouraged to assist with supporting Mom's milk supply.  [facility]. Asked Mom to breastfeed baby.  Baby quiet alert.  Baby had just fed 25 ml formula an hour prior. Mom latched baby in cradle hold.  Showed Mom how to support baby and support her breast on pillow, and use cross cradle hold.  Hand expressed drop of colostrum.  Baby able to open widely and latch on.  Reviewed importance of a deep areolar latch and holding baby in closely.  Baby quickly became sleepy on the breast.   Assisted Mom to double pump to stimulate her milk supply.  Plan- 1- Keep baby STS as much as possible 2- Every 3 hrs, awaken baby for feedings.  If baby awakens earlier, to feed baby then. 3-Breastfeed (limit 30 mins) and then supplement per volume guidelines, 20-30 ml today, increasing to >30 ml after 7pm tonight. 4- Pump both breasts 15-20 mins.  Hand express and breast massage also. 5- Follow-up with OP lactation next week.  Mom seems very motivated to breastfeed her baby.  Engorgement prevention and treatment reviewed. Mom knows to call prn for concerns.  Reviewed milk storage guidelines in Baby booklet.  Interventions Interventions: Breast feeding basics reviewed;Assisted with latch;Skin to  skin;Breast massage;Hand express;Breast compression;Adjust position;Support pillows;Position options;Expressed milk;Hand pump;DEBP  Lactation Tools Discussed/Used Tools: Pump;Bottle Breast pump type: Double-Electric Breast Pump;Manual WIC Program: Yes   Consult Status Consult Status: Follow-up Date: 07/15/18 Follow-up type: Out-patient    Michelle [name], Michelle [name] 07/11/2018, 12:13 PM

## 2018-08-13 NOTE — L&D Delivery Note (Signed)
OB/GYN Faculty Practice Delivery Note  [name] is a 33 y.o. [phone] s/p NSVD at [redacted]w[redacted]d She was admitted for active labor.   ROM: 0h 019mith clear fluid GBS Status: negative Maximum Maternal Temperature: 97.6 F  Labor Progress: . Patient admitted at 8 cm and progressed to complete shortly thereafter  Delivery Date/Time: 07/22/2019, 0131 hours Delivery: Met patient in hallway and accompanied to room. Shortly after transferring to labor bed from stretcher patient complained about having to defecate. Bulging bag was noted at the introitus, so AROM was performed, revealing a crowning head. Head delivered ROA and restituted LOT. No nuchal cord present. Shoulder and body delivered in usual fashion. Infant with spontaneous cry, placed on mother's abdomen, dried and stimulated. Cord clamped x 2 after 1-minute delay, and cut by me. Cord blood drawn. Placenta delivered spontaneously with gentle cord traction. Fundus firm with massage and Pitocin. Labia, perineum, vagina, and cervix were inspected, and found to be intact.   Placenta: 3 vessel cord, intact, to L&D Complications: None Lacerations: None EBL: 97 mL Analgesia: None  Postpartum Planning [ ]  message to sent to schedule follow-up  [ ]  vaccines UTD  Infant: female  APGARs 9 & 9  weight per medical record  HaMerilyn BabaDO OB/GYN Fellow, Faculty Practice

## 2018-11-21 ENCOUNTER — Emergency Department (HOSPITAL_COMMUNITY)
Admission: EM | Admit: 2018-11-21 | Discharge: 2018-11-21 | Disposition: A | Discharge status: Home / Self Care | Payer: Medicaid Other | Attending: Emergency Medicine | Admitting: Emergency Medicine

## 2018-11-21 ENCOUNTER — Other Ambulatory Visit: Payer: Self-pay

## 2018-11-21 ENCOUNTER — Encounter (HOSPITAL_COMMUNITY): Payer: Self-pay | Admitting: Emergency Medicine

## 2018-11-21 DIAGNOSIS — J029 Acute pharyngitis, unspecified: Secondary | ICD-10-CM | POA: Diagnosis not present

## 2018-11-21 DIAGNOSIS — Z79899 Other long term (current) drug therapy: Secondary | ICD-10-CM | POA: Insufficient documentation

## 2018-11-21 DIAGNOSIS — Z87891 Personal history of nicotine dependence: Secondary | ICD-10-CM | POA: Insufficient documentation

## 2018-11-21 DIAGNOSIS — O9989 Other specified diseases and conditions complicating pregnancy, childbirth and the puerperium: Secondary | ICD-10-CM | POA: Diagnosis not present

## 2018-11-21 DIAGNOSIS — Z3491 Encounter for supervision of normal pregnancy, unspecified, first trimester: Secondary | ICD-10-CM

## 2018-11-21 DIAGNOSIS — Z3A00 Weeks of gestation of pregnancy not specified: Secondary | ICD-10-CM | POA: Insufficient documentation

## 2018-11-21 LAB — POC URINE PREG, ED: Preg Test, Ur: POSITIVE — AB

## 2018-11-21 LAB — GROUP A STREP BY PCR: Group A Strep by PCR: NOT DETECTED

## 2018-11-21 MED ORDER — LIDOCAINE VISCOUS HCL 2 % MT SOLN
15.0000 mL | Freq: Once | OROMUCOSAL | Status: AC
  Administered 2018-11-21: 15 mL via ORAL
  Filled 2018-11-21: qty 15

## 2018-11-21 MED ORDER — ALUM & MAG HYDROXIDE-SIMETH 200-200-20 MG/5ML PO SUSP
30.0000 mL | Freq: Once | ORAL | Status: AC
  Administered 2018-11-21: 30 mL via ORAL
  Filled 2018-11-21: qty 30

## 2018-11-21 NOTE — ED Triage Notes (Signed)
Pt states that she is having 6/10 soret throat, pt state that she feels like something is stuck on her throat, she is having some SOB and panic attack. Pt states her breathing is feeling much better after she started waking to the hospital and that she is been scare that she may be pregnant.

## 2018-11-21 NOTE — ED Provider Notes (Signed)
[facility] EMERGENCY DEPARTMENT Provider Note   CSN: [id number] Arrival date & time: 11/21/18  0059    History   Chief Complaint Chief Complaint  Patient presents with  . Sore Throat    HPI Michelle [name] is a 33 y.o. female.     Patient presents with throat discomfort.  She reports that there is something scratching in the back of her throat, worsens when she swallows.  She feels like something is stuck in the throat.  She reports that she became very concerned about this and then think she had a panic attack.  Her breathing rate increased and she became very anxious.  She felt short of breath, like she could not take a full breath.  This is improving now.  She still has the sore throat.  He has not had any significant cough.  No nasal congestion, no fever.     Past Medical History:  Diagnosis Date  . Anemia    On Iron Supplements  . Anxiety   . Chlamydia   . Insomnia     Patient Active Problem List   Diagnosis Date Noted  . Pregnancy 07/08/2018  . Family history of congenital anomalies 05/28/2018  . Trichomonas contact, treated 05/28/2018  . Uses marijuana 05/28/2018  . Gonorrhea affecting pregnancy 05/27/2018  . Chlamydia infection affecting pregnancy 05/27/2018  . Supervision of other normal pregnancy, antepartum 05/27/2018  . Social problem 05/27/2018  . Dental caries 05/27/2018  . Supervision of other normal pregnancy 03/28/2011    Past Surgical History:  Procedure Laterality Date  . EYE SURGERY Left      OB History    Gravida  3   Para  3   Term  3   Preterm  0   AB  0   Living  3     SAB  0   TAB  0   Ectopic  0   Multiple  0   Live Births  3            Home Medications    Prior to Admission medications   Medication Sig Start Date End Date Taking? Authorizing Provider  acetaminophen (TYLENOL) 325 MG tablet Take 650 mg by mouth every 6 (six) hours as needed for mild pain.     [provider]   famotidine (PEPCID) 20 MG tablet Take 40 mg by mouth as needed. For heart burn  04/04/11 07/08/18  Adam Phenix, MD  ibuprofen (ADVIL,MOTRIN) 600 MG tablet Take 1 tablet (600 mg total) by mouth every 6 (six) hours. 07/10/18   Anderson, Chelsey L, DO  prenatal vitamin w/FE, FA (PRENATAL 1 + 1) 27-1 MG TABS Take 1 tablet by mouth daily. 05/04/11   Charm Rings, MD  zolpidem (AMBIEN) 5 MG tablet Take 1 tablet (5 mg total) by mouth at bedtime as needed for sleep. 06/24/18   [name], CNM    Family History No family history on file.  Social History Social History   Tobacco Use  . Smoking status: Former Games developer  . Smokeless tobacco: Never Used  Substance Use Topics  . Alcohol use: No  . Drug use: Yes    Types: Marijuana    Comment: states she quit, positive lab tes     Allergies   Latex   Review of Systems Review of Systems  Constitutional: Negative for fever.  HENT: Negative for sore throat.   Respiratory: Positive for shortness of breath.   Cardiovascular: Positive for  palpitations.  Psychiatric/Behavioral: The patient is nervous/anxious.   All other systems reviewed and are negative.    Physical Exam Updated Vital Signs BP 121/60 (BP Location: Right Arm)   Pulse 73   Temp 98.3 F (36.8 C) (Oral)   Resp 18   Ht  (1.626 m)   Wt 80.3 kg   LMP 10/05/2018   SpO2 99%   BMI 30.38 kg/m   Physical Exam Vitals signs and nursing note reviewed.  Constitutional:      General: She is not in acute distress.    Appearance: Normal appearance. She is well-developed.  HENT:     Head: Normocephalic and atraumatic.     Right Ear: Hearing normal.     Left Ear: Hearing normal.     Nose: Nose normal.  Eyes:     Conjunctiva/sclera: Conjunctivae normal.     Pupils: Pupils are equal, round, and reactive to light.  Neck:     Musculoskeletal: Normal range of motion and neck supple.  Cardiovascular:     Rate and Rhythm: Regular rhythm.     Heart sounds: S1 normal and  S2 normal. No murmur. No friction rub. No gallop.   Pulmonary:     Effort: Pulmonary effort is normal. No respiratory distress.     Breath sounds: Normal breath sounds.  Chest:     Chest wall: No tenderness.  Abdominal:     General: Bowel sounds are normal.     Palpations: Abdomen is soft.     Tenderness: There is no abdominal tenderness. There is no guarding or rebound. Negative signs include Murphy's sign and McBurney's sign.     Hernia: No hernia is present.  Musculoskeletal: Normal range of motion.  Skin:    General: Skin is warm and dry.     Findings: No rash.  Neurological:     Mental Status: She is alert and oriented to person, place, and time.     GCS: GCS eye subscore is 4. GCS verbal subscore is 5. GCS motor subscore is 6.     Cranial Nerves: No cranial nerve deficit.     Sensory: No sensory deficit.     Coordination: Coordination normal.  Psychiatric:        Speech: Speech normal.        Behavior: Behavior normal.        Thought Content: Thought content normal.      ED Treatments / Results  Labs (all labs ordered are listed, but only abnormal results are displayed) Labs Reviewed  POC URINE PREG, ED - Abnormal; Notable for the following components:      Result Value   Preg Test, Ur POSITIVE (*)    All other components within normal limits  GROUP A STREP BY PCR    EKG None  Radiology No results found.  Procedures Procedures (including critical care time)  Medications Ordered in ED Medications  alum & mag hydroxide-simeth (MAALOX/MYLANTA) 200-200-20 MG/5ML suspension 30 mL (30 mLs Oral Given 11/21/18 0145)    And  lidocaine (XYLOCAINE) 2 % viscous mouth solution 15 mL (15 mLs Oral Given 11/21/18 0145)     Initial Impression / Assessment and Plan / ED Course  I have reviewed the triage vital signs and the nursing notes.  Pertinent labs & imaging results that were available during my care of the patient were reviewed by me and considered in my medical  decision making (see chart for details).        Patient presents  to the emergency department with multiple concerns.  Patient has a scratchiness in her throat that hurts when she swallows.  She thinks that something might be stuck in her throat.  She does not recall swallowing anything and having a sudden onset of symptoms, however.  Oropharyngeal examination is normal.  She is swallowing without difficulty.  No concern for esophageal obstruction.  Strep is negative.  Patient had an episode of what sounds like a panic attack prior to coming in.  This was brought on by sore throat sensation as well as concern over possible pregnancy.  Her pregnancy test was positive here in the ER.  She is not experiencing any abdominal or pelvic complaints.  There is no vaginal bleeding, no vaginal discharge.  She will need outpatient OB/GYN follow-up.  Final Clinical Impressions(s) / ED Diagnoses   Final diagnoses:  Pharyngitis, unspecified etiology  First trimester pregnancy    ED Discharge Orders    None       Nahuel Wilbert, Canary Brimhristopher J, MD 11/21/18 [phone]

## 2019-03-08 ENCOUNTER — Other Ambulatory Visit: Payer: Self-pay

## 2019-03-08 ENCOUNTER — Emergency Department (HOSPITAL_COMMUNITY)
Admission: EM | Admit: 2019-03-08 | Discharge: 2019-03-08 | Disposition: A | Discharge status: Home / Self Care | Payer: Medicaid Other | Attending: Emergency Medicine | Admitting: Emergency Medicine

## 2019-03-08 ENCOUNTER — Encounter (HOSPITAL_COMMUNITY): Payer: Self-pay | Admitting: Emergency Medicine

## 2019-03-08 DIAGNOSIS — K0889 Other specified disorders of teeth and supporting structures: Secondary | ICD-10-CM | POA: Insufficient documentation

## 2019-03-08 DIAGNOSIS — K047 Periapical abscess without sinus: Secondary | ICD-10-CM | POA: Diagnosis not present

## 2019-03-08 DIAGNOSIS — R6884 Jaw pain: Secondary | ICD-10-CM | POA: Diagnosis present

## 2019-03-08 DIAGNOSIS — Z87891 Personal history of nicotine dependence: Secondary | ICD-10-CM | POA: Insufficient documentation

## 2019-03-08 MED ORDER — ACETAMINOPHEN 500 MG PO TABS
1000.0000 mg | ORAL_TABLET | Freq: Once | ORAL | Status: AC
  Administered 2019-03-08: 22:00:00 1000 mg via ORAL
  Filled 2019-03-08: qty 2

## 2019-03-08 MED ORDER — PENICILLIN V POTASSIUM 500 MG PO TABS: 500.0000 mg | ORAL_TABLET | Freq: Four times a day (QID) | ORAL | 0 refills | Status: AC

## 2019-03-08 MED ORDER — PENICILLIN V POTASSIUM 250 MG PO TABS
500.0000 mg | ORAL_TABLET | Freq: Once | ORAL | Status: AC
  Administered 2019-03-08: 500 mg via ORAL
  Filled 2019-03-08: qty 2

## 2019-03-08 NOTE — ED Triage Notes (Signed)
Patient is here with lower jaw pain.  Patient has poor dental health, lower jaw is swollen and teeth are black and broken off.  Patient is 5 mos pregnant and no abdominal pain/vaginal discharge.

## 2019-03-08 NOTE — ED Provider Notes (Signed)
[facility] EMERGENCY DEPARTMENT Provider Note   CSN: [id number] Arrival date & time: 03/08/19  1907    History   Chief Complaint Chief Complaint  Patient presents with  . Jaw Pain    HPI Michelle [name] is a 33 y.o. female.     Michelle [name] is a 33 y.o. female currently 5 months pregnant, who presents to the emergency department for evaluation of dental pain.  Patient reports severe pain along her lower gumline over the past 3 days.  The majority of her teeth on the lower gums are either missing or broken off at the gum level and black and decaying.  Patient reports history of multiple dental infections in the past, does not have a dentist that she follows with.  She reports pain is a constant ache.  She denies any pain or swelling underneath her tongue.  No difficulty swallowing or breathing.  She does report some swollen lymph nodes underneath her chin but no other masses or areas of pain in her neck.  No fevers or chills, no nausea or vomiting.  She has taken Tylenol and ibuprofen at home but continues to have pain, last took pain medications early this morning.  No other aggravating or alleviating factors.     Past Medical History:  Diagnosis Date  . Anemia    On Iron Supplements  . Anxiety   . Chlamydia   . Insomnia     Patient Active Problem List   Diagnosis Date Noted  . Pregnancy 07/08/2018  . Family history of congenital anomalies 05/28/2018  . Trichomonas contact, treated 05/28/2018  . Uses marijuana 05/28/2018  . Gonorrhea affecting pregnancy 05/27/2018  . Chlamydia infection affecting pregnancy 05/27/2018  . Supervision of other normal pregnancy, antepartum 05/27/2018  . Social problem 05/27/2018  . Dental caries 05/27/2018  . Supervision of other normal pregnancy 03/28/2011    Past Surgical History:  Procedure Laterality Date  . EYE SURGERY Left      OB History    Gravida  4   Para  3   Term  3   Preterm  0   AB  0    Living  3     SAB  0   TAB  0   Ectopic  0   Multiple  0   Live Births  3            Home Medications    Prior to Admission medications   Medication Sig Start Date End Date Taking? Authorizing Provider  acetaminophen (TYLENOL) 325 MG tablet Take 650 mg by mouth every 6 (six) hours as needed for mild pain.     [provider]  famotidine (PEPCID) 20 MG tablet Take 40 mg by mouth as needed. For heart burn  04/04/11 07/08/18  Woodroe Mode, MD  ibuprofen (ADVIL,MOTRIN) 600 MG tablet Take 1 tablet (600 mg total) by mouth every 6 (six) hours. 07/10/18   Anderson, Chelsey L, DO  penicillin v potassium (VEETID) 500 MG tablet Take 1 tablet (500 mg total) by mouth 4 (four) times daily for 7 days. 03/08/19 03/15/19  Jacqlyn Larsen, PA-C  prenatal vitamin w/FE, FA (PRENATAL 1 + 1) 27-1 MG TABS Take 1 tablet by mouth daily. 05/04/11   Melony Overly, MD  zolpidem (AMBIEN) 5 MG tablet Take 1 tablet (5 mg total) by mouth at bedtime as needed for sleep. 06/24/18   Manya Silvas, CNM    Family History No family  history on file.  Social History Social History   Tobacco Use  . Smoking status: Former Games developermoker  . Smokeless tobacco: Never Used  Substance Use Topics  . Alcohol use: No  . Drug use: Yes    Types: Marijuana    Comment: states she quit, positive lab tes     Allergies   Latex   Review of Systems Review of Systems  Constitutional: Negative for chills and fever.  HENT: Positive for dental problem. Negative for facial swelling.   Respiratory: Negative for stridor.   Gastrointestinal: Negative for nausea and vomiting.     Physical Exam Updated Vital Signs BP 102/65 (BP Location: Left Arm)   Pulse 65   Temp 98.7 F (37.1 C) (Oral)   Resp 16   LMP 10/05/2018   SpO2 100%   Physical Exam Vitals signs and nursing note reviewed.  Constitutional:      General: She is not in acute distress.    Appearance: Normal appearance. She is well-developed and normal  weight. She is not ill-appearing or diaphoretic.  HENT:     Head: Normocephalic and atraumatic.     Mouth/Throat:     Comments: The majority of patient's teeth missing along the lower jawline, several are broken off at the gum level and are black and decaying, erythema of the gums but no purulent drainage or evident abscess, no swelling or tenderness under the tongue, posterior oropharynx clear, no trismus.  Normal phonation.  Tolerating secretions. Eyes:     General:        Right eye: No discharge.        Left eye: No discharge.  Neck:     Musculoskeletal: Neck supple. No muscular tenderness.     Comments: Mild cervical lymphadenopathy, no stridor, no palpable masses. Pulmonary:     Effort: Pulmonary effort is normal. No respiratory distress.  Abdominal:     Comments: Gravid abdomen  Lymphadenopathy:     Cervical: Cervical adenopathy present.  Skin:    General: Skin is warm and dry.  Neurological:     Mental Status: She is alert.     Coordination: Coordination normal.  Psychiatric:        Mood and Affect: Mood normal.        Behavior: Behavior normal.      ED Treatments / Results  Labs (all labs ordered are listed, but only abnormal results are displayed) Labs Reviewed - No data to display  EKG None  Radiology No results found.  Procedures Procedures (including critical care time)  Medications Ordered in ED Medications  acetaminophen (TYLENOL) tablet 1,000 mg (1,000 mg Oral Given 03/08/19 2143)  penicillin v potassium (VEETID) tablet 500 mg (500 mg Oral Given 03/08/19 2143)     Initial Impression / Assessment and Plan / ED Course  I have reviewed the triage vital signs and the nursing notes.  Pertinent labs & imaging results that were available during my care of the patient were reviewed by me and considered in my medical decision making (see chart for details).  Patient with toothache.  No gross abscess.  Exam unconcerning for Ludwig's angina or spread of  infection.  Will treat with penicillin and tylenol, given current pregnancy.  Urged patient to follow-up with dentist, dental resources provided.   Final Clinical Impressions(s) / ED Diagnoses   Final diagnoses:  Dental infection  Pain, dental    ED Discharge Orders         Ordered    penicillin v  potassium (VEETID) 500 MG tablet  4 times daily     03/08/19 2256           Legrand RamsFord, Hadley Soileau N, PA-C 03/08/19 2335    Charlynne PanderYao, David Hsienta, MD 03/10/19 [phone]

## 2019-03-08 NOTE — ED Notes (Signed)
Patient verbalizes understanding of discharge instructions. Opportunity for questioning and answers were provided. Armband removed by staff, pt discharged from ED ambulatory to home.  

## 2019-03-08 NOTE — Discharge Instructions (Signed)
Please take entire course of antibiotics as directed.  Continue using Tylenol for pain.  You will need to follow-up with your dentist for continued management of this.  Return to the emergency department for fevers, swelling or pain under the tongue or in the neck, difficulty breathing or swallowing or any other new or concerning symptoms.

## 2019-05-28 ENCOUNTER — Ambulatory Visit (INDEPENDENT_AMBULATORY_CARE_PROVIDER_SITE_OTHER): Payer: Medicaid Other | Admitting: Medical

## 2019-05-28 ENCOUNTER — Other Ambulatory Visit: Payer: Self-pay

## 2019-05-28 ENCOUNTER — Encounter: Payer: Self-pay | Admitting: Medical

## 2019-05-28 ENCOUNTER — Telehealth: Payer: Self-pay | Admitting: Emergency Medicine

## 2019-05-28 ENCOUNTER — Other Ambulatory Visit (HOSPITAL_COMMUNITY)
Admission: RE | Admit: 2019-05-28 | Discharge: 2019-05-28 | Disposition: A | Discharge status: Home / Self Care | Payer: Medicaid Other | Source: Ambulatory Visit | Attending: Medical | Admitting: Medical

## 2019-05-28 ENCOUNTER — Other Ambulatory Visit: Payer: Self-pay | Admitting: Emergency Medicine

## 2019-05-28 DIAGNOSIS — O0990 Supervision of high risk pregnancy, unspecified, unspecified trimester: Secondary | ICD-10-CM | POA: Insufficient documentation

## 2019-05-28 DIAGNOSIS — O0993 Supervision of high risk pregnancy, unspecified, third trimester: Secondary | ICD-10-CM

## 2019-05-28 DIAGNOSIS — O0933 Supervision of pregnancy with insufficient antenatal care, third trimester: Secondary | ICD-10-CM

## 2019-05-28 DIAGNOSIS — O09893 Supervision of other high risk pregnancies, third trimester: Secondary | ICD-10-CM | POA: Diagnosis not present

## 2019-05-28 DIAGNOSIS — O0930 Supervision of pregnancy with insufficient antenatal care, unspecified trimester: Secondary | ICD-10-CM | POA: Insufficient documentation

## 2019-05-28 DIAGNOSIS — Z3A33 33 weeks gestation of pregnancy: Secondary | ICD-10-CM

## 2019-05-28 DIAGNOSIS — Z8619 Personal history of other infectious and parasitic diseases: Secondary | ICD-10-CM | POA: Diagnosis not present

## 2019-05-28 MED ORDER — BLOOD PRESSURE MONITORING DEVI: 1.0000 | 0 refills | Status: DC

## 2019-05-28 NOTE — Patient Instructions (Signed)
Fetal Movement Counts Patient Name: ________________________________________________ Patient Due Date: ____________________ What is a fetal movement count?  A fetal movement count is the number of times that you feel your baby move during a certain amount of time. This may also be called a fetal kick count. A fetal movement count is recommended for every pregnant woman. You may be asked to start counting fetal movements as early as week 28 of your pregnancy. Pay attention to when your baby is most active. You may notice your baby's sleep and wake cycles. You may also notice things that make your baby move more. You should do a fetal movement count:  When your baby is normally most active.  At the same time each day. A good time to count movements is while you are resting, after having something to eat and drink. How do I count fetal movements? 1. Find a quiet, comfortable area. Sit, or lie down on your side. 2. Write down the date, the start time and stop time, and the number of movements that you felt between those two times. Take this information with you to your health care visits. 3. For 2 hours, count kicks, flutters, swishes, rolls, and jabs. You should feel at least 10 movements during 2 hours. 4. You may stop counting after you have felt 10 movements. 5. If you do not feel 10 movements in 2 hours, have something to eat and drink. Then, keep resting and counting for 1 hour. If you feel at least 4 movements during that hour, you may stop counting. Contact a health care provider if:  You feel fewer than 4 movements in 2 hours.  Your baby is not moving like he or she usually does. Date: ____________ Start time: ____________ Stop time: ____________ Movements: ____________ Date: ____________ Start time: ____________ Stop time: ____________ Movements: ____________ Date: ____________ Start time: ____________ Stop time: ____________ Movements: ____________ Date: ____________ Start time:  ____________ Stop time: ____________ Movements: ____________ Date: ____________ Start time: ____________ Stop time: ____________ Movements: ____________ Date: ____________ Start time: ____________ Stop time: ____________ Movements: ____________ Date: ____________ Start time: ____________ Stop time: ____________ Movements: ____________ Date: ____________ Start time: ____________ Stop time: ____________ Movements: ____________ Date: ____________ Start time: ____________ Stop time: ____________ Movements: ____________ This information is not intended to replace advice given to you by your health care provider. Make sure you discuss any questions you have with your health care provider. Document Released: 08/29/2006 Document Revised: 08/19/2018 Document Reviewed: 09/08/2015 Elsevier Patient Education  2020 [employer] Braxton Hicks Contractions Contractions of the uterus can occur throughout pregnancy, but they are not always a sign that you are in labor. You may have practice contractions called Braxton Hicks contractions. These false labor contractions are sometimes confused with true labor. What are Braxton Hicks contractions? Braxton Hicks contractions are tightening movements that occur in the muscles of the uterus before labor. Unlike true labor contractions, these contractions do not result in opening (dilation) and thinning of the cervix. Toward the end of pregnancy (32-34 weeks), Braxton Hicks contractions can happen more often and may become stronger. These contractions are sometimes difficult to tell apart from true labor because they can be very uncomfortable. You should not feel embarrassed if you go to the hospital with false labor. Sometimes, the only way to tell if you are in true labor is for your health care provider to look for changes in the cervix. The health care provider will do a physical exam and may monitor your contractions. If you   are not in true labor, the exam should show  that your cervix is not dilating and your water has not broken. If there are no other health problems associated with your pregnancy, it is completely safe for you to be sent home with false labor. You may continue to have Braxton Hicks contractions until you go into true labor. How to tell the difference between true labor and false labor True labor  Contractions last 30-70 seconds.  Contractions become very regular.  Discomfort is usually felt in the top of the uterus, and it spreads to the lower abdomen and low back.  Contractions do not go away with walking.  Contractions usually become more intense and increase in frequency.  The cervix dilates and gets thinner. False labor  Contractions are usually shorter and not as strong as true labor contractions.  Contractions are usually irregular.  Contractions are often felt in the front of the lower abdomen and in the groin.  Contractions may go away when you walk around or change positions while lying down.  Contractions get weaker and are shorter-lasting as time goes on.  The cervix usually does not dilate or become thin. Follow these instructions at home:   Take over-the-counter and prescription medicines only as told by your health care provider.  Keep up with your usual exercises and follow other instructions from your health care provider.  Eat and drink lightly if you think you are going into labor.  If Braxton Hicks contractions are making you uncomfortable: ? Change your position from lying down or resting to walking, or change from walking to resting. ? Sit and rest in a tub of warm water. ? Drink enough fluid to keep your urine pale yellow. Dehydration may cause these contractions. ? Do slow and deep breathing several times an hour.  Keep all follow-up prenatal visits as told by your health care provider. This is important. Contact a health care provider if:  You have a fever.  You have continuous pain in  your abdomen. Get help right away if:  Your contractions become stronger, more regular, and closer together.  You have fluid leaking or gushing from your vagina.  You pass blood-tinged mucus (bloody show).  You have bleeding from your vagina.  You have low back pain that you never had before.  You feel your baby's head pushing down and causing pelvic pressure.  Your baby is not moving inside you as much as it used to. Summary  Contractions that occur before labor are called Braxton Hicks contractions, false labor, or practice contractions.  Braxton Hicks contractions are usually shorter, weaker, farther apart, and less regular than true labor contractions. True labor contractions usually become progressively stronger and regular, and they become more frequent.  Manage discomfort from Braxton Hicks contractions by changing position, resting in a warm bath, drinking plenty of water, or practicing deep breathing. This information is not intended to replace advice given to you by your health care provider. Make sure you discuss any questions you have with your health care provider. Document Released: 12/13/2016 Document Revised: 07/12/2017 Document Reviewed: 12/13/2016 Elsevier Patient Education  2020 Elsevier Inc.  

## 2019-05-28 NOTE — Progress Notes (Signed)
blood

## 2019-05-28 NOTE — Progress Notes (Signed)
   PRENATAL VISIT NOTE  Subjective:  Michelle [name] is a 33 y.o. [phone] at [redacted]w[redacted]d being seen today for her first prenatal visit for this pregnancy.  She is currently monitored for the following issues for this low-risk pregnancy and has Gonorrhea affecting pregnancy; Social problem; Dental caries; Family history of congenital anomalies; Trichomonas contact, treated; Uses marijuana; Pregnancy; Supervision of high risk pregnancy, antepartum; and Late prenatal care on their problem list.  Patient reports no complaints.  Contractions: Irritability. Vag. Bleeding: None.  Movement: Present. Denies leaking of fluid.   She is planning to both bottle and breastfeed. Desires BTL for contraception.   The following portions of the patient's history were reviewed and updated as appropriate: allergies, current medications, past family history, past medical history, past social history, past surgical history and problem list.   Objective:   Vitals:   05/28/19 1136  BP: 105/63  Pulse: 99  Temp: 98.5 F (36.9 C)  Weight: 187 lb 9.6 oz (85.1 kg)    Fetal Status: Fetal Heart Rate (bpm): 144 Fundal Height: 34 cm Movement: Present     General:  Alert, oriented and cooperative. Patient is in no acute distress.  Skin: Skin is warm and dry. No rash noted.   Cardiovascular: Normal heart rate and rhythm noted  Respiratory: Normal respiratory effort, no problems with respiration noted. Clear to auscultation.   Abdomen: Soft, gravid, appropriate for gestational age. Normal bowel sounds. Non-tender. Pain/Pressure: Present     Pelvic: Cervical exam deferred        Extremities: Normal range of motion.  Edema: None  Mental Status: Normal mood and affect. Normal behavior. Normal judgment and thought content.   Assessment and Plan:  Pregnancy: G4P3003 at [redacted]w[redacted]d 1. Supervision of high risk pregnancy, antepartum - Culture, OB Urine - [place] MFM OB DETAIL +14 WK; Future - Genetic Screening - Obstetric Panel,  Including HIV - Hemoglobin A1c - [facility] AMB BABYSCRIPTS OPT IN - Cervicovaginal ancillary only( [place]) - Had carrier screening in previous pregnancy  - Normal pap with last pregnancy in 2019  2. Late prenatal care - First visit today   3. Short interval between pregnancies - Delivered last child 06/2018  Preterm labor symptoms and general obstetric precautions including but not limited to vaginal bleeding, contractions, leaking of fluid and fetal movement were reviewed in detail with the patient. Please refer to After Visit Summary for other counseling recommendations.   Return in about 2 weeks (around 06/11/2019) for LOB, In-Person.  Future Appointments  Date Time Provider [place]  06/02/2019  8:50 AM [facility] LAB [facility]OC  06/11/2019 10:35 AM Rasch, Artist Pais, NP [facility] WOC    Kerry Hough, PA-C

## 2019-05-28 NOTE — Telephone Encounter (Signed)
Called pt to advise of upcoming U/S appointment on 06/10/2019 @ 2p. Pt verbalized understanding.

## 2019-05-29 LAB — HEMOGLOBIN A1C
Est. average glucose Bld gHb Est-mCnc: 105 mg/dL
Hgb A1c MFr Bld: 5.3 % (ref 4.8–5.6)

## 2019-05-29 LAB — OBSTETRIC PANEL, INCLUDING HIV
Antibody Screen: NEGATIVE
Basophils Absolute: 0 10*3/uL (ref 0.0–0.2)
Basos: 0 %
EOS (ABSOLUTE): 0.1 10*3/uL (ref 0.0–0.4)
Eos: 2 %
HIV Screen 4th Generation wRfx: NONREACTIVE
Hematocrit: 34.2 % (ref 34.0–46.6)
Hemoglobin: 10.9 g/dL — ABNORMAL LOW (ref 11.1–15.9)
Hepatitis B Surface Ag: NEGATIVE
Immature Grans (Abs): 0.1 10*3/uL (ref 0.0–0.1)
Immature Granulocytes: 1 %
Lymphocytes Absolute: 1.9 10*3/uL (ref 0.7–3.1)
Lymphs: 20 %
MCH: 26 pg — ABNORMAL LOW (ref 26.6–33.0)
MCHC: 31.9 g/dL (ref 31.5–35.7)
MCV: 81 fL (ref 79–97)
Monocytes Absolute: 0.8 10*3/uL (ref 0.1–0.9)
Monocytes: 8 %
Neutrophils Absolute: 6.5 10*3/uL (ref 1.4–7.0)
Neutrophils: 69 %
Platelets: 332 10*3/uL (ref 150–450)
RBC: 4.2 x10E6/uL (ref 3.77–5.28)
RDW: 16.3 % — ABNORMAL HIGH (ref 11.7–15.4)
RPR Ser Ql: NONREACTIVE
Rh Factor: POSITIVE
Rubella Antibodies, IGG: 1.27 index (ref >0.99)
WBC: 9.5 10*3/uL (ref 3.4–10.8)

## 2019-05-30 LAB — CULTURE, OB URINE

## 2019-05-30 LAB — URINE CULTURE, OB REFLEX

## 2019-06-02 ENCOUNTER — Other Ambulatory Visit: Payer: Medicaid Other

## 2019-06-02 LAB — CERVICOVAGINAL ANCILLARY ONLY
Chlamydia: NEGATIVE
Comment: NEGATIVE
Comment: NORMAL
Neisseria Gonorrhea: NEGATIVE

## 2019-06-04 ENCOUNTER — Other Ambulatory Visit: Payer: Self-pay | Admitting: Lactation Services

## 2019-06-04 DIAGNOSIS — O0993 Supervision of high risk pregnancy, unspecified, third trimester: Secondary | ICD-10-CM

## 2019-06-09 ENCOUNTER — Encounter: Payer: Self-pay | Admitting: *Deleted

## 2019-06-10 ENCOUNTER — Other Ambulatory Visit: Payer: Self-pay

## 2019-06-10 ENCOUNTER — Ambulatory Visit (HOSPITAL_COMMUNITY): Payer: Medicaid Other | Admitting: *Deleted

## 2019-06-10 ENCOUNTER — Ambulatory Visit (HOSPITAL_COMMUNITY)
Admission: RE | Admit: 2019-06-10 | Discharge: 2019-06-10 | Disposition: A | Discharge status: Home / Self Care | Payer: Medicaid Other | Source: Ambulatory Visit | Attending: Obstetrics and Gynecology | Admitting: Obstetrics and Gynecology

## 2019-06-10 ENCOUNTER — Encounter (HOSPITAL_COMMUNITY): Payer: Self-pay | Admitting: *Deleted

## 2019-06-10 ENCOUNTER — Other Ambulatory Visit: Payer: Self-pay | Admitting: Medical

## 2019-06-10 ENCOUNTER — Other Ambulatory Visit (HOSPITAL_COMMUNITY): Payer: Self-pay | Admitting: *Deleted

## 2019-06-10 VITALS — BP 102/60 | HR 82 | Temp 98.5°F

## 2019-06-10 DIAGNOSIS — O0990 Supervision of high risk pregnancy, unspecified, unspecified trimester: Secondary | ICD-10-CM | POA: Diagnosis present

## 2019-06-10 DIAGNOSIS — O0933 Supervision of pregnancy with insufficient antenatal care, third trimester: Secondary | ICD-10-CM

## 2019-06-10 DIAGNOSIS — O0930 Supervision of pregnancy with insufficient antenatal care, unspecified trimester: Secondary | ICD-10-CM | POA: Diagnosis present

## 2019-06-10 DIAGNOSIS — O09293 Supervision of pregnancy with other poor reproductive or obstetric history, third trimester: Secondary | ICD-10-CM | POA: Diagnosis not present

## 2019-06-10 DIAGNOSIS — Z3A32 32 weeks gestation of pregnancy: Secondary | ICD-10-CM

## 2019-06-10 DIAGNOSIS — O365930 Maternal care for other known or suspected poor fetal growth, third trimester, not applicable or unspecified: Secondary | ICD-10-CM

## 2019-06-10 DIAGNOSIS — O09893 Supervision of other high risk pregnancies, third trimester: Secondary | ICD-10-CM | POA: Diagnosis not present

## 2019-06-10 DIAGNOSIS — Z362 Encounter for other antenatal screening follow-up: Secondary | ICD-10-CM

## 2019-06-11 ENCOUNTER — Other Ambulatory Visit: Payer: Medicaid Other

## 2019-06-11 ENCOUNTER — Encounter: Payer: Medicaid Other | Admitting: Obstetrics and Gynecology

## 2019-06-11 ENCOUNTER — Encounter: Payer: Self-pay | Admitting: Obstetrics & Gynecology

## 2019-06-16 ENCOUNTER — Encounter: Payer: Self-pay | Admitting: *Deleted

## 2019-06-17 ENCOUNTER — Encounter: Payer: Medicaid Other | Admitting: Advanced Practice Midwife

## 2019-06-17 ENCOUNTER — Other Ambulatory Visit: Payer: Medicaid Other

## 2019-06-23 ENCOUNTER — Other Ambulatory Visit: Payer: Self-pay

## 2019-06-23 ENCOUNTER — Telehealth (INDEPENDENT_AMBULATORY_CARE_PROVIDER_SITE_OTHER): Payer: Medicaid Other | Admitting: Medical

## 2019-06-23 DIAGNOSIS — O0933 Supervision of pregnancy with insufficient antenatal care, third trimester: Secondary | ICD-10-CM

## 2019-06-23 DIAGNOSIS — O0930 Supervision of pregnancy with insufficient antenatal care, unspecified trimester: Secondary | ICD-10-CM

## 2019-06-23 DIAGNOSIS — Z3A34 34 weeks gestation of pregnancy: Secondary | ICD-10-CM

## 2019-06-23 DIAGNOSIS — O0993 Supervision of high risk pregnancy, unspecified, third trimester: Secondary | ICD-10-CM

## 2019-06-23 DIAGNOSIS — O09893 Supervision of other high risk pregnancies, third trimester: Secondary | ICD-10-CM

## 2019-06-23 DIAGNOSIS — O0990 Supervision of high risk pregnancy, unspecified, unspecified trimester: Secondary | ICD-10-CM

## 2019-06-23 NOTE — Progress Notes (Signed)
I connected with  Michelle [name] on 06/23/19 at  8:35 AM EST by telephone and verified that I am speaking with the correct person using two identifiers.   I discussed the limitations, risks, security and privacy concerns of performing an evaluation and management service by telephone and the availability of in person appointments. I also discussed with the patient that there may be a patient responsible charge related to this service. The patient expressed understanding and agreed to proceed.  Michelle Carmine, RN 06/23/2019  8:20 AM

## 2019-06-23 NOTE — Progress Notes (Signed)
I connected with Michelle [name] on 06/23/19 at  8:35 AM EST by: MyChart and verified that I am speaking with the correct person using two identifiers.  Patient is located at home and provider is located at [facility].     The purpose of this virtual visit is to provide medical care while limiting exposure to the novel coronavirus. I discussed the limitations, risks, security and privacy concerns of performing an evaluation and management service by MyChart and the availability of in person appointments. I also discussed with the patient that there may be a patient responsible charge related to this service. By engaging in this virtual visit, you consent to the provision of healthcare.  Additionally, you authorize for your insurance to be billed for the services provided during this visit.  The patient expressed understanding and agreed to proceed.  The following staff members participated in the virtual visit:  Verdell Carmine, RN    PRENATAL VISIT NOTE  Subjective:  Michelle [name] is a 33 y.o. [phone] at [redacted]w[redacted]d  for phone visit for ongoing prenatal care.  She is currently monitored for the following issues for this high-risk pregnancy and has Social problem; Dental caries; Family history of congenital anomalies; Uses marijuana; Supervision of high risk pregnancy, antepartum; Late prenatal care; Short interval between pregnancies affecting pregnancy in third trimester, antepartum; History of trichomoniasis; and History of gonorrhea on their problem list.  Patient reports backache.  Contractions: Irritability. Vag. Bleeding: None.  Movement: Present. Denies leaking of fluid.   The following portions of the patient's history were reviewed and updated as appropriate: allergies, current medications, past family history, past medical history, past social history, past surgical history and problem list.   Objective:  There were no vitals filed for this visit. Self-Obtained  Fetal Status:      Movement: Present     Assessment and Plan:  Pregnancy: G4P3003 at [redacted]w[redacted]d 1. Supervision of high risk pregnancy, antepartum - Doing well - Due date changed based on MFM [place] from a few weeks ago - Missed appointment for third trimester labs, will schedule for Friday am, patient voiced understanding  - Needs TDap at next visit   2. Late prenatal care  3. Short interval between pregnancies affecting pregnancy in third trimester, antepartum   Preterm labor symptoms and general obstetric precautions including but not limited to vaginal bleeding, contractions, leaking of fluid and fetal movement were reviewed in detail with the patient.  Return in about 2 weeks (around 07/07/2019) for LOB, In-Person.  Future Appointments  Date Time Provider [place]  07/08/2019  1:45 PM Kinder Percival [facility]  07/08/2019  1:45 PM [place] 2 [facility]     Time spent on virtual visit: 10 minutes  Kerry Hough, PA-C

## 2019-06-23 NOTE — Patient Instructions (Signed)
Fetal Movement Counts Patient Name: ________________________________________________ Patient Due Date: ____________________ What is a fetal movement count?  A fetal movement count is the number of times that you feel your baby move during a certain amount of time. This may also be called a fetal kick count. A fetal movement count is recommended for every pregnant woman. You may be asked to start counting fetal movements as early as week 28 of your pregnancy. Pay attention to when your baby is most active. You may notice your baby's sleep and wake cycles. You may also notice things that make your baby move more. You should do a fetal movement count:  When your baby is normally most active.  At the same time each day. A good time to count movements is while you are resting, after having something to eat and drink. How do I count fetal movements? 1. Find a quiet, comfortable area. Sit, or lie down on your side. 2. Write down the date, the start time and stop time, and the number of movements that you felt between those two times. Take this information with you to your health care visits. 3. For 2 hours, count kicks, flutters, swishes, rolls, and jabs. You should feel at least 10 movements during 2 hours. 4. You may stop counting after you have felt 10 movements. 5. If you do not feel 10 movements in 2 hours, have something to eat and drink. Then, keep resting and counting for 1 hour. If you feel at least 4 movements during that hour, you may stop counting. Contact a health care provider if:  You feel fewer than 4 movements in 2 hours.  Your baby is not moving like he or she usually does. Date: ____________ Start time: ____________ Stop time: ____________ Movements: ____________ Date: ____________ Start time: ____________ Stop time: ____________ Movements: ____________ Date: ____________ Start time: ____________ Stop time: ____________ Movements: ____________ Date: ____________ Start time:  ____________ Stop time: ____________ Movements: ____________ Date: ____________ Start time: ____________ Stop time: ____________ Movements: ____________ Date: ____________ Start time: ____________ Stop time: ____________ Movements: ____________ Date: ____________ Start time: ____________ Stop time: ____________ Movements: ____________ Date: ____________ Start time: ____________ Stop time: ____________ Movements: ____________ Date: ____________ Start time: ____________ Stop time: ____________ Movements: ____________ This information is not intended to replace advice given to you by your health care provider. Make sure you discuss any questions you have with your health care provider. Document Released: 08/29/2006 Document Revised: 08/19/2018 Document Reviewed: 09/08/2015 Elsevier Patient Education  2020 [employer] Braxton Hicks Contractions Contractions of the uterus can occur throughout pregnancy, but they are not always a sign that you are in labor. You may have practice contractions called Braxton Hicks contractions. These false labor contractions are sometimes confused with true labor. What are Braxton Hicks contractions? Braxton Hicks contractions are tightening movements that occur in the muscles of the uterus before labor. Unlike true labor contractions, these contractions do not result in opening (dilation) and thinning of the cervix. Toward the end of pregnancy (32-34 weeks), Braxton Hicks contractions can happen more often and may become stronger. These contractions are sometimes difficult to tell apart from true labor because they can be very uncomfortable. You should not feel embarrassed if you go to the hospital with false labor. Sometimes, the only way to tell if you are in true labor is for your health care provider to look for changes in the cervix. The health care provider will do a physical exam and may monitor your contractions. If you   are not in true labor, the exam should show  that your cervix is not dilating and your water has not broken. If there are no other health problems associated with your pregnancy, it is completely safe for you to be sent home with false labor. You may continue to have Braxton Hicks contractions until you go into true labor. How to tell the difference between true labor and false labor True labor  Contractions last 30-70 seconds.  Contractions become very regular.  Discomfort is usually felt in the top of the uterus, and it spreads to the lower abdomen and low back.  Contractions do not go away with walking.  Contractions usually become more intense and increase in frequency.  The cervix dilates and gets thinner. False labor  Contractions are usually shorter and not as strong as true labor contractions.  Contractions are usually irregular.  Contractions are often felt in the front of the lower abdomen and in the groin.  Contractions may go away when you walk around or change positions while lying down.  Contractions get weaker and are shorter-lasting as time goes on.  The cervix usually does not dilate or become thin. Follow these instructions at home:   Take over-the-counter and prescription medicines only as told by your health care provider.  Keep up with your usual exercises and follow other instructions from your health care provider.  Eat and drink lightly if you think you are going into labor.  If Braxton Hicks contractions are making you uncomfortable: ? Change your position from lying down or resting to walking, or change from walking to resting. ? Sit and rest in a tub of warm water. ? Drink enough fluid to keep your urine pale yellow. Dehydration may cause these contractions. ? Do slow and deep breathing several times an hour.  Keep all follow-up prenatal visits as told by your health care provider. This is important. Contact a health care provider if:  You have a fever.  You have continuous pain in  your abdomen. Get help right away if:  Your contractions become stronger, more regular, and closer together.  You have fluid leaking or gushing from your vagina.  You pass blood-tinged mucus (bloody show).  You have bleeding from your vagina.  You have low back pain that you never had before.  You feel your baby's head pushing down and causing pelvic pressure.  Your baby is not moving inside you as much as it used to. Summary  Contractions that occur before labor are called Braxton Hicks contractions, false labor, or practice contractions.  Braxton Hicks contractions are usually shorter, weaker, farther apart, and less regular than true labor contractions. True labor contractions usually become progressively stronger and regular, and they become more frequent.  Manage discomfort from Braxton Hicks contractions by changing position, resting in a warm bath, drinking plenty of water, or practicing deep breathing. This information is not intended to replace advice given to you by your health care provider. Make sure you discuss any questions you have with your health care provider. Document Released: 12/13/2016 Document Revised: 07/12/2017 Document Reviewed: 12/13/2016 Elsevier Patient Education  2020 Elsevier Inc.  

## 2019-06-26 ENCOUNTER — Other Ambulatory Visit: Payer: Medicaid Other

## 2019-07-08 ENCOUNTER — Ambulatory Visit (INDEPENDENT_AMBULATORY_CARE_PROVIDER_SITE_OTHER): Payer: Medicaid Other | Admitting: Advanced Practice Midwife

## 2019-07-08 ENCOUNTER — Ambulatory Visit (HOSPITAL_COMMUNITY): Payer: Medicaid Other | Admitting: *Deleted

## 2019-07-08 ENCOUNTER — Ambulatory Visit (HOSPITAL_COMMUNITY)
Admission: RE | Admit: 2019-07-08 | Discharge: 2019-07-08 | Disposition: A | Discharge status: Home / Self Care | Payer: Medicaid Other | Source: Ambulatory Visit | Attending: Obstetrics and Gynecology | Admitting: Obstetrics and Gynecology

## 2019-07-08 ENCOUNTER — Encounter: Payer: Self-pay | Admitting: Advanced Practice Midwife

## 2019-07-08 ENCOUNTER — Other Ambulatory Visit (HOSPITAL_COMMUNITY)
Admission: RE | Admit: 2019-07-08 | Discharge: 2019-07-08 | Disposition: A | Discharge status: Home / Self Care | Payer: Medicaid Other | Source: Ambulatory Visit | Attending: Advanced Practice Midwife | Admitting: Advanced Practice Midwife

## 2019-07-08 ENCOUNTER — Other Ambulatory Visit: Payer: Self-pay

## 2019-07-08 ENCOUNTER — Encounter (HOSPITAL_COMMUNITY): Payer: Self-pay

## 2019-07-08 VITALS — BP 110/64 | HR 76 | Temp 97.3°F

## 2019-07-08 VITALS — BP 106/60 | HR 67 | Wt 191.0 lb

## 2019-07-08 DIAGNOSIS — Z3A36 36 weeks gestation of pregnancy: Secondary | ICD-10-CM

## 2019-07-08 DIAGNOSIS — O0990 Supervision of high risk pregnancy, unspecified, unspecified trimester: Secondary | ICD-10-CM

## 2019-07-08 DIAGNOSIS — O09299 Supervision of pregnancy with other poor reproductive or obstetric history, unspecified trimester: Secondary | ICD-10-CM

## 2019-07-08 DIAGNOSIS — O09899 Supervision of other high risk pregnancies, unspecified trimester: Secondary | ICD-10-CM | POA: Diagnosis present

## 2019-07-08 DIAGNOSIS — O0993 Supervision of high risk pregnancy, unspecified, third trimester: Secondary | ICD-10-CM | POA: Diagnosis not present

## 2019-07-08 DIAGNOSIS — Z362 Encounter for other antenatal screening follow-up: Secondary | ICD-10-CM | POA: Diagnosis not present

## 2019-07-08 DIAGNOSIS — O365990 Maternal care for other known or suspected poor fetal growth, unspecified trimester, not applicable or unspecified: Secondary | ICD-10-CM

## 2019-07-08 DIAGNOSIS — O0930 Supervision of pregnancy with insufficient antenatal care, unspecified trimester: Secondary | ICD-10-CM | POA: Diagnosis not present

## 2019-07-08 DIAGNOSIS — O09893 Supervision of other high risk pregnancies, third trimester: Secondary | ICD-10-CM

## 2019-07-08 NOTE — Progress Notes (Signed)
   PRENATAL VISIT NOTE  Subjective:  Michelle [name] is a 33 y.o. [phone] at [redacted]w[redacted]d being seen today for ongoing prenatal care.  She is currently monitored for the following issues for this low-risk pregnancy and has Social problem; Dental caries; Family history of congenital anomalies; Uses marijuana; Supervision of high risk pregnancy, antepartum; Late prenatal care; Short interval between pregnancies affecting pregnancy in third trimester, antepartum; History of trichomoniasis; and History of gonorrhea on their problem list.  Patient reports no complaints.  Contractions: Irritability. Vag. Bleeding: None.  Movement: Present. Denies leaking of fluid.   The following portions of the patient's history were reviewed and updated as appropriate: allergies, current medications, past family history, past medical history, past social history, past surgical history and problem list.   Objective:   Vitals:   07/08/19 1526  BP: 106/60  Pulse: 67  Weight: 191 lb (86.6 kg)    Fetal Status: Fetal Heart Rate (bpm): U/S Fundal Height: 36 cm Movement: Present  Presentation: Vertex  General:  Alert, oriented and cooperative. Patient is in no acute distress.  Skin: Skin is warm and dry. No rash noted.   Cardiovascular: Normal heart rate noted  Respiratory: Normal respiratory effort, no problems with respiration noted  Abdomen: Soft, gravid, appropriate for gestational age.  Pain/Pressure: Present     Pelvic: Cervical exam performed Dilation: 1 Effacement (%): Thick Station: -3  Extremities: Normal range of motion.  Edema: None  Mental Status: Normal mood and affect. Normal behavior. Normal judgment and thought content.   Assessment and Plan:  Pregnancy: G4P3003 at [redacted]w[redacted]d 1. Supervision of high risk pregnancy, antepartum - routine care - Culture, beta strep (group b only) - Cervicovaginal ancillary only( [place])  Preterm labor symptoms and general obstetric precautions including but not  limited to vaginal bleeding, contractions, leaking of fluid and fetal movement were reviewed in detail with the patient. Please refer to After Visit Summary for other counseling recommendations.   Return in about 1 week (around 07/15/2019) for virtual visit .  No future appointments.  Marcille Buffy DNP, CNM  07/08/19  3:38 PM

## 2019-07-10 LAB — CERVICOVAGINAL ANCILLARY ONLY
Chlamydia: NEGATIVE
Comment: NEGATIVE
Comment: NORMAL
Neisseria Gonorrhea: NEGATIVE

## 2019-07-12 LAB — CULTURE, BETA STREP (GROUP B ONLY): Strep Gp B Culture: NEGATIVE

## 2019-07-16 ENCOUNTER — Telehealth (INDEPENDENT_AMBULATORY_CARE_PROVIDER_SITE_OTHER): Payer: Medicaid Other | Admitting: Obstetrics and Gynecology

## 2019-07-16 DIAGNOSIS — O0933 Supervision of pregnancy with insufficient antenatal care, third trimester: Secondary | ICD-10-CM

## 2019-07-16 DIAGNOSIS — O0930 Supervision of pregnancy with insufficient antenatal care, unspecified trimester: Secondary | ICD-10-CM

## 2019-07-16 DIAGNOSIS — O0990 Supervision of high risk pregnancy, unspecified, unspecified trimester: Secondary | ICD-10-CM

## 2019-07-16 DIAGNOSIS — Z3A37 37 weeks gestation of pregnancy: Secondary | ICD-10-CM | POA: Diagnosis not present

## 2019-07-16 DIAGNOSIS — O0993 Supervision of high risk pregnancy, unspecified, third trimester: Secondary | ICD-10-CM | POA: Diagnosis not present

## 2019-07-16 NOTE — Patient Instructions (Signed)

## 2019-07-16 NOTE — Progress Notes (Signed)
I connected with  Dorrene German on 07/16/19 at  2:55 PM EST by telephone and verified that I am speaking with the correct person using two identifiers.   I discussed the limitations, risks, security and privacy concerns of performing an evaluation and management service by telephone and the availability of in person appointments. I also discussed with the patient that there may be a patient responsible charge related to this service. The patient expressed understanding and agreed to proceed.  [place], [place] 07/16/2019  3:03 PM

## 2019-07-16 NOTE — Progress Notes (Signed)
   TELEHEALTH VIRTUAL OBSTETRICS VISIT ENCOUNTER NOTE  I connected with Michelle [name] on 07/16/19 at  2:55 PM EST by telephone at home and verified that I am speaking with the correct person using two identifiers.   I discussed the limitations, risks, security and privacy concerns of performing an evaluation and management service by telephone and the availability of in person appointments. I also discussed with the patient that there may be a patient responsible charge related to this service. The patient expressed understanding and agreed to proceed.  Subjective:  Michelle [name] is a 33 y.o. [phone] at [redacted]w[redacted]d being followed for ongoing prenatal care.  She is currently monitored for the following issues for this low-risk pregnancy and has Social problem; Dental caries; Family history of congenital anomalies; Uses marijuana; Supervision of high risk pregnancy, antepartum; Late prenatal care; Short interval between pregnancies affecting pregnancy in third trimester, antepartum; History of trichomoniasis; and History of gonorrhea on their problem list.  Patient reports no complaints. Reports fetal movement. Denies any contractions, bleeding or leaking of fluid.   The following portions of the patient's history were reviewed and updated as appropriate: allergies, current medications, past family history, past medical history, past social history, past surgical history and problem list.   Objective:   General:  Alert, oriented and cooperative.   Mental Status: Normal mood and affect perceived. Normal judgment and thought content.  Rest of physical exam deferred due to type of encounter  Assessment and Plan:  Pregnancy: [id number] at [redacted]w[redacted]d  1. Supervision of high risk pregnancy, antepartum  Did not log BP today. She is not home currently. She will log this when she gets home.  GBS negative   2. Late prenatal care  She missed her glucose test. She needs to come ASAP. States she may be able  to come tomorrow morning.     Term labor symptoms and general obstetric precautions including but not limited to vaginal bleeding, contractions, leaking of fluid and fetal movement were reviewed in detail with the patient.  I discussed the assessment and treatment plan with the patient. The patient was provided an opportunity to ask questions and all were answered. The patient agreed with the plan and demonstrated an understanding of the instructions. The patient was advised to call back or seek an in-person office evaluation/go to MAU at [facility] for any urgent or concerning symptoms. Please refer to After Visit Summary for other counseling recommendations.   I provided 10 minutes of non-face-to-face time during this encounter.  Return for Return ASAP for glucose testing .  No future appointments.  Noni Saupe, NP [facility], [place]

## 2019-07-20 ENCOUNTER — Inpatient Hospital Stay (HOSPITAL_COMMUNITY)
Admission: AD | Admit: 2019-07-20 | Discharge: 2019-07-20 | Disposition: A | Discharge status: Home / Self Care | Payer: Medicaid Other | Attending: Obstetrics & Gynecology | Admitting: Obstetrics & Gynecology

## 2019-07-20 ENCOUNTER — Other Ambulatory Visit: Payer: Self-pay

## 2019-07-20 ENCOUNTER — Encounter (HOSPITAL_COMMUNITY): Payer: Self-pay

## 2019-07-20 DIAGNOSIS — O471 False labor at or after 37 completed weeks of gestation: Secondary | ICD-10-CM | POA: Insufficient documentation

## 2019-07-20 DIAGNOSIS — Z3A37 37 weeks gestation of pregnancy: Secondary | ICD-10-CM | POA: Insufficient documentation

## 2019-07-20 NOTE — MAU Note (Signed)
.   Michelle [name] is a 33 y.o. at [redacted]w[redacted]d here in MAU reporting: Ctx that started around 0600 that were 3-4 minutes apart. No VB or LOF. + Fetal movement.   Pain score: 7 Vitals:   07/20/19 0923  BP: 114/65  Pulse: 99  Resp: 18  Temp: 97.6 F (36.4 C)  SpO2: 100%     FHT:151

## 2019-07-20 NOTE — Discharge Instructions (Signed)
Braxton Hicks Contractions Contractions of the uterus can occur throughout pregnancy, but they are not always a sign that you are in labor. You may have practice contractions called Braxton Hicks contractions. These false labor contractions are sometimes confused with true labor. What are Braxton Hicks contractions? Braxton Hicks contractions are tightening movements that occur in the muscles of the uterus before labor. Unlike true labor contractions, these contractions do not result in opening (dilation) and thinning of the cervix. Toward the end of pregnancy (32-34 weeks), Braxton Hicks contractions can happen more often and may become stronger. These contractions are sometimes difficult to tell apart from true labor because they can be very uncomfortable. You should not feel embarrassed if you go to the hospital with false labor. Sometimes, the only way to tell if you are in true labor is for your health care provider to look for changes in the cervix. The health care provider will do a physical exam and may monitor your contractions. If you are not in true labor, the exam should show that your cervix is not dilating and your water has not broken. If there are no other health problems associated with your pregnancy, it is completely safe for you to be sent home with false labor. You may continue to have Braxton Hicks contractions until you go into true labor. How to tell the difference between true labor and false labor True labor  Contractions last 30-70 seconds.  Contractions become very regular.  Discomfort is usually felt in the top of the uterus, and it spreads to the lower abdomen and low back.  Contractions do not go away with walking.  Contractions usually become more intense and increase in frequency.  The cervix dilates and gets thinner. False labor  Contractions are usually shorter and not as strong as true labor contractions.  Contractions are usually irregular.  Contractions  are often felt in the front of the lower abdomen and in the groin.  Contractions may go away when you walk around or change positions while lying down.  Contractions get weaker and are shorter-lasting as time goes on.  The cervix usually does not dilate or become thin. Follow these instructions at home:   Take over-the-counter and prescription medicines only as told by your health care provider.  Keep up with your usual exercises and follow other instructions from your health care provider.  Eat and drink lightly if you think you are going into labor.  If Braxton Hicks contractions are making you uncomfortable: ? Change your position from lying down or resting to walking, or change from walking to resting. ? Sit and rest in a tub of warm water. ? Drink enough fluid to keep your urine pale yellow. Dehydration may cause these contractions. ? Do slow and deep breathing several times an hour.  Keep all follow-up prenatal visits as told by your health care provider. This is important. Contact a health care provider if:  You have a fever.  You have continuous pain in your abdomen. Get help right away if:  Your contractions become stronger, more regular, and closer together.  You have fluid leaking or gushing from your vagina.  You pass blood-tinged mucus (bloody show).  You have bleeding from your vagina.  You have low back pain that you never had before.  You feel your baby's head pushing down and causing pelvic pressure.  Your baby is not moving inside you as much as it used to. Summary  Contractions that occur before labor are   called Braxton Hicks contractions, false labor, or practice contractions.  Braxton Hicks contractions are usually shorter, weaker, farther apart, and less regular than true labor contractions. True labor contractions usually become progressively stronger and regular, and they become more frequent.  Manage discomfort from Braxton Hicks contractions  by changing position, resting in a warm bath, drinking plenty of water, or practicing deep breathing. This information is not intended to replace advice given to you by your health care provider. Make sure you discuss any questions you have with your health care provider. Document Released: 12/13/2016 Document Revised: 07/12/2017 Document Reviewed: 12/13/2016 Elsevier Patient Education  2020 Elsevier Inc[employer]  

## 2019-07-22 ENCOUNTER — Inpatient Hospital Stay (HOSPITAL_COMMUNITY)
Admission: AD | Admit: 2019-07-22 | Discharge: 2019-07-23 | DRG: 806 | Disposition: A | Discharge status: Home / Self Care | Payer: Medicaid Other | Attending: Obstetrics & Gynecology | Admitting: Obstetrics & Gynecology

## 2019-07-22 ENCOUNTER — Encounter (HOSPITAL_COMMUNITY): Payer: Self-pay

## 2019-07-22 ENCOUNTER — Inpatient Hospital Stay (HOSPITAL_COMMUNITY): Payer: Medicaid Other | Admitting: Anesthesiology

## 2019-07-22 DIAGNOSIS — O99324 Drug use complicating childbirth: Secondary | ICD-10-CM | POA: Diagnosis present

## 2019-07-22 DIAGNOSIS — F1290 Cannabis use, unspecified, uncomplicated: Secondary | ICD-10-CM | POA: Diagnosis present

## 2019-07-22 DIAGNOSIS — Z30017 Encounter for initial prescription of implantable subdermal contraceptive: Secondary | ICD-10-CM | POA: Diagnosis not present

## 2019-07-22 DIAGNOSIS — O365930 Maternal care for other known or suspected poor fetal growth, third trimester, not applicable or unspecified: Secondary | ICD-10-CM | POA: Diagnosis present

## 2019-07-22 DIAGNOSIS — Z3046 Encounter for surveillance of implantable subdermal contraceptive: Secondary | ICD-10-CM | POA: Diagnosis present

## 2019-07-22 DIAGNOSIS — Z20828 Contact with and (suspected) exposure to other viral communicable diseases: Secondary | ICD-10-CM | POA: Diagnosis present

## 2019-07-22 DIAGNOSIS — Z87891 Personal history of nicotine dependence: Secondary | ICD-10-CM

## 2019-07-22 DIAGNOSIS — Z3A38 38 weeks gestation of pregnancy: Secondary | ICD-10-CM

## 2019-07-22 DIAGNOSIS — O26893 Other specified pregnancy related conditions, third trimester: Secondary | ICD-10-CM | POA: Diagnosis present

## 2019-07-22 LAB — CBC
HCT: 37.2 % (ref 36.0–46.0)
Hemoglobin: 11.6 g/dL — ABNORMAL LOW (ref 12.0–15.0)
MCH: 25.8 pg — ABNORMAL LOW (ref 26.0–34.0)
MCHC: 31.2 g/dL (ref 30.0–36.0)
MCV: 82.9 fL (ref 80.0–100.0)
Platelets: 320 10*3/uL (ref 150–400)
RBC: 4.49 MIL/uL (ref 3.87–5.11)
RDW: 16.8 % — ABNORMAL HIGH (ref 11.5–15.5)
WBC: 10.4 10*3/uL (ref 4.0–10.5)
nRBC: 0 % (ref 0.0–0.2)

## 2019-07-22 LAB — RESPIRATORY PANEL BY RT PCR (FLU A&B, COVID)
Influenza A by PCR: NEGATIVE
Influenza B by PCR: NEGATIVE
SARS Coronavirus 2 by RT PCR: NEGATIVE

## 2019-07-22 LAB — ABO/RH: ABO/RH(D): O POS

## 2019-07-22 LAB — TYPE AND SCREEN
ABO/RH(D): O POS
Antibody Screen: NEGATIVE

## 2019-07-22 LAB — RPR: RPR Ser Ql: NONREACTIVE

## 2019-07-22 MED ORDER — WITCH HAZEL-GLYCERIN EX PADS: 1.0000 "application " | MEDICATED_PAD | CUTANEOUS | Status: DC | PRN

## 2019-07-22 MED ORDER — DIPHENHYDRAMINE HCL 25 MG PO CAPS: 25.0000 mg | ORAL_CAPSULE | Freq: Four times a day (QID) | ORAL | Status: DC | PRN

## 2019-07-22 MED ORDER — IBUPROFEN 600 MG PO TABS
600.0000 mg | ORAL_TABLET | Freq: Four times a day (QID) | ORAL | Status: DC
  Administered 2019-07-22 – 2019-07-23 (×6): 600 mg via ORAL
  Filled 2019-07-22 (×6): qty 1

## 2019-07-22 MED ORDER — LACTATED RINGERS IV SOLN: 500.0000 mL | INTRAVENOUS | Status: DC | PRN

## 2019-07-22 MED ORDER — SOD CITRATE-CITRIC ACID 500-334 MG/5ML PO SOLN: 30.0000 mL | ORAL | Status: DC | PRN

## 2019-07-22 MED ORDER — ONDANSETRON HCL 4 MG PO TABS: 4.0000 mg | ORAL_TABLET | ORAL | Status: DC | PRN

## 2019-07-22 MED ORDER — OXYCODONE-ACETAMINOPHEN 5-325 MG PO TABS: 1.0000 | ORAL_TABLET | ORAL | Status: DC | PRN

## 2019-07-22 MED ORDER — OXYCODONE-ACETAMINOPHEN 5-325 MG PO TABS: 2.0000 | ORAL_TABLET | ORAL | Status: DC | PRN

## 2019-07-22 MED ORDER — COCONUT OIL OIL
1.0000 "application " | TOPICAL_OIL | Status: DC | PRN
  Administered 2019-07-23: 1 via TOPICAL

## 2019-07-22 MED ORDER — METOCLOPRAMIDE HCL 10 MG PO TABS
10.0000 mg | ORAL_TABLET | Freq: Once | ORAL | Status: DC
  Filled 2019-07-22 (×2): qty 1

## 2019-07-22 MED ORDER — LACTATED RINGERS IV SOLN: INTRAVENOUS | Status: DC

## 2019-07-22 MED ORDER — FENTANYL CITRATE (PF) 100 MCG/2ML IJ SOLN
100.0000 ug | INTRAMUSCULAR | Status: DC | PRN
  Filled 2019-07-22: qty 2

## 2019-07-22 MED ORDER — SIMETHICONE 80 MG PO CHEW: 80.0000 mg | CHEWABLE_TABLET | ORAL | Status: DC | PRN

## 2019-07-22 MED ORDER — SENNOSIDES-DOCUSATE SODIUM 8.6-50 MG PO TABS
2.0000 | ORAL_TABLET | ORAL | Status: DC
  Administered 2019-07-22: 2 via ORAL
  Filled 2019-07-22: qty 2

## 2019-07-22 MED ORDER — OXYTOCIN BOLUS FROM INFUSION
500.0000 mL | Freq: Once | INTRAVENOUS | Status: AC
  Administered 2019-07-22: 500 mL via INTRAVENOUS

## 2019-07-22 MED ORDER — ACETAMINOPHEN 325 MG PO TABS
650.0000 mg | ORAL_TABLET | ORAL | Status: DC | PRN
  Administered 2019-07-22 (×3): 650 mg via ORAL
  Filled 2019-07-22 (×3): qty 2

## 2019-07-22 MED ORDER — ONDANSETRON HCL 4 MG/2ML IJ SOLN: 4.0000 mg | Freq: Four times a day (QID) | INTRAMUSCULAR | Status: DC | PRN

## 2019-07-22 MED ORDER — PRENATAL MULTIVITAMIN CH
1.0000 | ORAL_TABLET | Freq: Every day | ORAL | Status: DC
  Administered 2019-07-22 – 2019-07-23 (×2): 1 via ORAL
  Filled 2019-07-22 (×2): qty 1

## 2019-07-22 MED ORDER — FAMOTIDINE 20 MG PO TABS
40.0000 mg | ORAL_TABLET | Freq: Once | ORAL | Status: DC
  Filled 2019-07-22 (×2): qty 2

## 2019-07-22 MED ORDER — BENZOCAINE-MENTHOL 20-0.5 % EX AERO: 1.0000 "application " | INHALATION_SPRAY | CUTANEOUS | Status: DC | PRN

## 2019-07-22 MED ORDER — OXYTOCIN 40 UNITS IN NORMAL SALINE INFUSION - SIMPLE MED
2.5000 [IU]/h | INTRAVENOUS | Status: DC
  Administered 2019-07-22: 2.5 [IU]/h via INTRAVENOUS
  Filled 2019-07-22: qty 1000

## 2019-07-22 MED ORDER — OXYCODONE HCL 5 MG PO TABS: 10.0000 mg | ORAL_TABLET | ORAL | Status: DC | PRN

## 2019-07-22 MED ORDER — ACETAMINOPHEN 325 MG PO TABS: 650.0000 mg | ORAL_TABLET | ORAL | Status: DC | PRN

## 2019-07-22 MED ORDER — ZOLPIDEM TARTRATE 5 MG PO TABS: 5.0000 mg | ORAL_TABLET | Freq: Every evening | ORAL | Status: DC | PRN

## 2019-07-22 MED ORDER — ONDANSETRON HCL 4 MG/2ML IJ SOLN: 4.0000 mg | INTRAMUSCULAR | Status: DC | PRN

## 2019-07-22 MED ORDER — TETANUS-DIPHTH-ACELL PERTUSSIS 5-2.5-18.5 LF-MCG/0.5 IM SUSP: 0.5000 mL | Freq: Once | INTRAMUSCULAR | Status: DC

## 2019-07-22 MED ORDER — OXYCODONE HCL 5 MG PO TABS: 5.0000 mg | ORAL_TABLET | ORAL | Status: DC | PRN

## 2019-07-22 MED ORDER — DIBUCAINE (PERIANAL) 1 % EX OINT: 1.0000 "application " | TOPICAL_OINTMENT | CUTANEOUS | Status: DC | PRN

## 2019-07-22 MED ORDER — LIDOCAINE HCL (PF) 1 % IJ SOLN: 30.0000 mL | INTRAMUSCULAR | Status: DC | PRN

## 2019-07-22 NOTE — H&P (Signed)
OBSTETRIC ADMISSION HISTORY AND PHYSICAL  Michelle [name] is a 33 y.o. female [phone] with IUP at [redacted]w[redacted]d presenting for active labor. She reports +FMs. No LOF, VB, blurry vision, headaches, peripheral edema, or RUQ pain. She plans on breast and bottle feeding. She requests BTL for birth control (consent signed 05/28/19).  Dating: By LMP, changed per 3rd trimester [place] --->  Estimated Date of Delivery: 08/04/19  Sono:   @[redacted]w[redacted]d , normal anatomy, cephalic presentation, 2482g, , EFW 5#8  Prenatal History/Complications: Late prenatal care Insufficient prenatal care History of IUGR Marijuana use Short interval between pregnancies SGA fetus  Past Medical History: Past Medical History:  Diagnosis Date  . Anemia    On Iron Supplements  . Anxiety   . Chlamydia   . Insomnia     Past Surgical History: Past Surgical History:  Procedure Laterality Date  . EYE SURGERY Left     Obstetrical History: OB History    Gravida  4   Para  3   Term  3   Preterm  0   AB  0   Living  3     SAB  0   TAB  0   Ectopic  0   Multiple  0   Live Births  3           Social History: Social History   Socioeconomic History  . Marital status: Single    Spouse name: Not on file  . Number of children: Not on file  . Years of education: Not on file  . Highest education level: Not on file  Occupational History  . Not on file  Social Needs  . Financial resource strain: Not on file  . Food insecurity    Worry: Not on file    Inability: Not on file  . Transportation needs    Medical: Not on file    Non-medical: Not on file  Tobacco Use  . Smoking status: Former 46%FKC  . Smokeless tobacco: Never Used  Substance and Sexual Activity  . Alcohol use: No  . Drug use: Yes    Types: Marijuana    Comment: states she quit, positive lab tes  . Sexual activity: Yes    Birth control/protection: None  Lifestyle  . Physical activity    Days per week: Not on file    Minutes per  session: Not on file  . Stress: Not on file  Relationships  . Social Games developer on phone: Not on file    Gets together: Not on file    Attends religious service: Not on file    Active member of club or organization: Not on file    Attends meetings of clubs or organizations: Not on file    Relationship status: Not on file  Other Topics Concern  . Not on file  Social History Narrative  . Not on file    Family History: No family history on file.  Allergies: Allergies  Allergen Reactions  . Latex Itching    Medications Prior to Admission  Medication Sig Dispense Refill Last Dose  . acetaminophen (TYLENOL) 325 MG tablet Take 650 mg by mouth every 6 (six) hours as needed for mild pain.      . Blood Pressure Monitoring DEVI 1 Device by Does not apply route once a week. (Patient not taking: Reported on 06/23/2019) 1 Device 0   . famotidine (PEPCID) 20 MG tablet Take 20 mg by mouth 2 (two) times daily.     13/05/2019  prenatal vitamin w/FE, FA (PRENATAL 1 + 1) 27-1 MG TABS Take 1 tablet by mouth daily. 30 each 3      Review of Systems:  All systems reviewed and negative except as stated in HPI  PE: Blood pressure 131/85, pulse 68, temperature 98.1 F (36.7 C), temperature source Oral, resp. rate 18, last menstrual period 10/03/2018, unknown if currently breastfeeding. General appearance: alert, cooperative and uncomfortable with contractions Lungs: regular rate and effort Heart: regular rate  Abdomen: soft, non-tender Extremities: Homans sign is negative, no sign of DVT Presentation: cephalic EFM: Limited Toco: None Dilation: 10 Effacement (%): 100 Station: Crowning Exam by:: Dr. Darene Lamer  Prenatal labs: ABO, Rh: --/--/PENDING (12/09 0147) Antibody: PENDING (12/09 0147) Rubella: 1.27 (10/15 1229) RPR: Non Reactive (10/15 1229)  HBsAg: Negative (10/15 1229)  HIV: Non Reactive (10/15 1229)  GBS: Negative/-- (11/25 1552)  2 hour GTT not done  Prenatal Transfer  Tool  Maternal Diabetes: unknown, patient missed her 2 hour GTT Genetic Screening: Normal Maternal Ultrasounds/Referrals: Normal anatomy, measured SGA Fetal Ultrasounds or other Referrals:  Referred to Materal Fetal Medicine  for serial growth scans Maternal Substance Abuse:  Yes:  Type: Marijuana Significant Maternal Medications:  None Significant Maternal Lab Results: Group B Strep negative  Results for orders placed or performed during the hospital encounter of 07/22/19 (from the past 24 hour(s))  Type and screen [place]   Collection Time: 07/22/19  1:47 AM  Result Value Ref Range   ABO/RH(D) PENDING    Antibody Screen PENDING    Sample Expiration      07/25/2019,2359 Performed at [facility], [place] [address] [place], [place] [address]     Patient Active Problem List   Diagnosis Date Noted  . Normal labor 07/22/2019  . Supervision of high risk pregnancy, antepartum 05/28/2019  . Late prenatal care 05/28/2019  . Short interval between pregnancies affecting pregnancy in third trimester, antepartum 05/28/2019  . History of trichomoniasis 05/28/2019  . History of gonorrhea 05/28/2019  . Family history of congenital anomalies 05/28/2018  . Uses marijuana 05/28/2018  . Social problem 05/27/2018  . Dental caries 05/27/2018    Assessment: NAIMA VELDHUIZEN is a 33 y.o. [phone] at [redacted]w[redacted]d here for active labor  1. Labor: expectant management, multiparous precipitous delivery 2. FWB: limited monitoring 3. Pain: no time for meds 4. GBS: negative   Plan: Admit to Labor and Delivery, anticipate NSVD  Zoeya Gramajo L Shaolin Armas, DO  07/22/2019, 2:08 AM

## 2019-07-22 NOTE — Lactation Note (Signed)
This note was copied from a baby's chart. Lactation Consultation Note Baby 90 hrs old. Mom is BF/Formula feeding. That is her choice of feeding and was w/her other 3 children. Mom stated she BF her other 3 children ages 14,8,and 32 yr old for 6 months each. LPI information given and reviewed. Mom has been giving Gerber formula. Informed mom formula would be changed to Similac 22 cal. D/t less than 6lbs.  Newborn behavior, STS, I&O, breast massage, milk storage, pumping, supply and demand. Mom shown how to use DEBP & how to disassemble, clean, & reassemble parts. Mom informed to pump q3h for 15-20 min. Mom stated her nipples were sore she may not pump every 3 hrs. Nipples look intact. Discussed positioning and support. Encouraged to call for latch score. Mom encouraged to feed baby 8-12 times/24 hours and with feeding cues.  Mom has large pendulous breast w/everted nipple at bottom end of breast. Encouraged mom to call for assistance or questions. Lactation brochure given. Mom having BTL in am. Mom has no support person at bedside at this time.  Patient Name: Girl Katira Dumais Today's Date: 07/22/2019 Reason for consult: Initial assessment;Infant < 6lbs;Early term 37-38.6wks   Maternal Data Has patient been taught Hand Expression?: Yes Does the patient have breastfeeding experience prior to this delivery?: Yes  Feeding    LATCH Score       Type of Nipple: Everted at rest and after stimulation  Comfort (Breast/Nipple): Soft / non-tender        Interventions Interventions: Breast feeding basics reviewed;Breast massage;Position options;Breast compression;Hand express  Lactation Tools Discussed/Used WIC Program: Yes   Consult Status Consult Status: Follow-up Date: 07/23/19 Follow-up type: In-patient    Theodoro Kalata 07/22/2019, 10:42 PM

## 2019-07-22 NOTE — Anesthesia Preprocedure Evaluation (Signed)
Anesthesia Evaluation    Reviewed: Allergy & Precautions, Patient's Chart, lab work & pertinent test results  Airway Mallampati: II  TM Distance: >3 FB Neck ROM: Full    Dental  (+) Dental Advisory Given, Poor Dentition   Pulmonary former smoker,    Pulmonary exam normal breath sounds clear to auscultation       Cardiovascular negative cardio ROS Normal cardiovascular exam Rhythm:Regular Rate:Normal     Neuro/Psych PSYCHIATRIC DISORDERS Anxiety negative neurological ROS     GI/Hepatic GERD  Medicated and Controlled,(+)     substance abuse  marijuana use,   Endo/Other  negative endocrine ROS  Renal/GU negative Renal ROS  negative genitourinary   Musculoskeletal negative musculoskeletal ROS (+)   Abdominal Normal abdominal exam  (+)   Peds negative pediatric ROS (+)  Hematology  (+) anemia ,   Anesthesia Other Findings   Reproductive/Obstetrics S/p delivery 07/09/19                             Anesthesia Physical  Anesthesia Plan  ASA: II  Anesthesia Plan: Spinal   Post-op Pain Management:    Induction:   PONV Risk Score and Plan: 2 and Treatment may vary due to age or medical condition  Airway Management Planned: Natural Airway and Simple Face Mask  Additional Equipment: None  Intra-op Plan:   Post-operative Plan:   Informed Consent: I have reviewed the patients History and Physical, chart, labs and discussed the procedure including the risks, benefits and alternatives for the proposed anesthesia with the patient or authorized representative who has indicated his/her understanding and acceptance.       Plan Discussed with: CRNA  Anesthesia Plan Comments:        Anesthesia Quick Evaluation

## 2019-07-22 NOTE — Discharge Summary (Addendum)
Postpartum Discharge Summary  Date of Service updated12/05/2019     Patient Name: Michelle [name] DOB: 03/06/1986 MRN: [id number]  Date of admission: 07/22/2019 Delivering Provider: Merilyn Baba   Date of discharge: 07/23/2019  Admitting diagnosis: CTX less than two Intrauterine pregnancy: [redacted]w[redacted]d    Secondary diagnosis:  Active Problems:   Normal labor  Additional problems: Precipitous delivery     Discharge diagnosis: Term Pregnancy Delivered                                                                                                Post partum procedures:nexplanon patient  Augmentation: AROM  Complications: None  Hospital course:  Onset of Labor With Vaginal Delivery     33y.o. yo [id number] [id number] admitted in Active Labor on 07/22/2019. Patient had an uncomplicated labor course as follows:    Patient admitted at 8 cm and progressed to complete shortly thereafter  Membrane Rupture Time/Date: 1:30 AM ,07/22/2019   Intrapartum Procedures: Episiotomy: None [1]                                         Lacerations:  None [1]  Patient had a delivery of a Viable infant. 07/22/2019  Information for the patient's newborn:  HaYina, Riviere0[[id number]]Delivery Method: Vaginal, Spontaneous(Filed from Delivery Summary)     Pateint had an uncomplicated postpartum course.  She is ambulating, tolerating a regular diet, passing flatus, and urinating well. Patient is discharged home in stable condition on 07/23/19.  Delivery time: 1:31 AM    Magnesium Sulfate received: No BMZ received: No Rhophylac:No MMR:No Transfusion:No  Physical exam  Vitals:   07/22/19 1326 07/22/19 1804 07/22/19 2259 07/23/19 0601  BP: (!) 96/50 (!) 112/55 (!) 111/58 (!) 99/54  Pulse: 66 65 61 (!) 57  Resp: _0 Temp: 98.4 F (36.9 C) 98.2 F (36.8 C) 98.6 F (37 C) 97.7 F (36.5 C)  TempSrc: Oral Oral Oral Axillary  SpO2: 98% 98%     General: alert, cooperative and  no distress Lochia: appropriate Uterine Fundus: firm Incision: N/A DVT Evaluation: No evidence of DVT seen on physical exam. Labs: Lab Results  Component Value Date   WBC 10.4 07/22/2019   HGB 11.6 (L) 07/22/2019   HCT 37.2 07/22/2019   MCV 82.9 07/22/2019   PLT 320 07/22/2019   No flowsheet data found.  Discharge instruction: per After Visit Summary and "Baby and Me Booklet".  After visit meds:  Allergies as of 07/23/2019      Reactions   Latex Itching      Medication List    STOP taking these medications   Blood Pressure Monitoring Devi     TAKE these medications   famotidine 20 MG tablet Commonly known as: PEPCID Take 20 mg by mouth 2 (two) times daily.   ibuprofen 600 MG tablet Commonly known as: ADVIL Take 1 tablet (600 mg total) by mouth every 6 (six) hours.  prenatal vitamin w/FE, FA 27-1 MG Tabs tablet Take 1 tablet by mouth daily.   Tdap 5-2.5-18.5 LF-MCG/0.5 injection Commonly known as: BOOSTRIX Inject 0.5 mLs into the muscle once for 1 dose.   Tylenol 325 MG tablet Generic drug: acetaminophen Take 650 mg by mouth every 6 (six) hours as needed for mild pain.   witch hazel-glycerin pad Commonly known as: TUCKS Apply 1 application topically as needed for hemorrhoids.       Diet: routine diet  Activity: Advance as tolerated. Pelvic rest for 6 weeks.   Outpatient follow up:4 weeks Follow up Appt: Future Appointments  Date Time Provider [place]  08/19/2019  3:35 PM Luvenia Redden, PA-C [facility]   Follow up Visit: Please schedule this patient for Postpartum visit in: 4 weeks with the following provider: Any provider For C/S patients schedule nurse incision check in weeks 2 weeks: no Low risk pregnancy complicated by: late and insufficient prenatal care, marijuana use Delivery mode:  SVD Anticipated Birth Control:  Nexplanon PP Procedures needed: None  Schedule Integrated BH visit: no  Newborn Data: Live born female  Birth  Weight:  2716 g APGAR: 9, 9  Newborn Delivery   Birth date/time: 07/22/2019 01:31:00 Delivery type: Vaginal, Spontaneous      Baby Feeding: Bottle and Breast Disposition:home with mother   07/23/2019 Starr Lake, CNM

## 2019-07-22 NOTE — MAU Note (Signed)
Patient presents to MAU - with complaints of contractions.  No leaking or bleeding noted.  Wheelchair from lobby to room, assisted to get dressed in gown.  Cervix 8.5/100/0 with BBOW.  Limited database questions asked at this time, pt using breathing techniques with contractions. Delena Serve RN notified Buckner Malta RN charge nurse for room and Gavin Pound CNM notified Dr. Trixie Rude, orders for admit. Pt transferred to L&D via stretcher with Milinda Cave CNM accompanied RNs.  Dr. Trixie Rude met [place] in the room to assess pt.

## 2019-07-23 ENCOUNTER — Encounter (HOSPITAL_COMMUNITY)
Admission: AD | Disposition: A | Discharge status: Home / Self Care | Payer: Self-pay | Source: Home / Self Care | Attending: Obstetrics & Gynecology

## 2019-07-23 ENCOUNTER — Encounter (HOSPITAL_COMMUNITY): Payer: Self-pay | Admitting: Obstetrics & Gynecology

## 2019-07-23 DIAGNOSIS — Z3046 Encounter for surveillance of implantable subdermal contraceptive: Secondary | ICD-10-CM | POA: Diagnosis not present

## 2019-07-23 SURGERY — LIGATION, FALLOPIAN TUBE, POSTPARTUM
Anesthesia: Choice | Laterality: Bilateral

## 2019-07-23 MED ORDER — LIDOCAINE HCL 1 % IJ SOLN
0.0000 mL | Freq: Once | INTRAMUSCULAR | Status: AC | PRN
  Administered 2019-07-23: 20 mL via INTRADERMAL
  Filled 2019-07-23: qty 20

## 2019-07-23 MED ORDER — TETANUS-DIPHTH-ACELL PERTUSSIS 5-2.5-18.5 LF-MCG/0.5 IM SUSP: 0.5000 mL | Freq: Once | INTRAMUSCULAR | 0 refills | Status: AC

## 2019-07-23 MED ORDER — WITCH HAZEL-GLYCERIN EX PADS: 1.0000 "application " | MEDICATED_PAD | CUTANEOUS | 12 refills | Status: DC | PRN

## 2019-07-23 MED ORDER — IBUPROFEN 600 MG PO TABS: 600.0000 mg | ORAL_TABLET | Freq: Four times a day (QID) | ORAL | 0 refills | Status: DC

## 2019-07-23 MED ORDER — ETONOGESTREL 68 MG ~~LOC~~ IMPL
68.0000 mg | DRUG_IMPLANT | Freq: Once | SUBCUTANEOUS | Status: AC
  Administered 2019-07-23: 68 mg via SUBCUTANEOUS
  Filled 2019-07-23: qty 1

## 2019-07-23 NOTE — Progress Notes (Addendum)
Post Partum Day 1 Subjective: no complaints, up ad lib, voiding, tolerating PO and C/O being hungry.  States it is very hard to wait until surgery Scheduled for BTL today On earlier attempt to round on her this morning, she was on phone sobbing.   I came back later and she was more composed.   Objective: Blood pressure (!) 99/54, pulse (!) 57, temperature 97.7 F (36.5 C), temperature source Axillary, resp. rate 18, last menstrual period 10/03/2018, SpO2 98 %, unknown if currently breastfeeding.  Physical Exam:  General: alert, cooperative and no distress Lochia: appropriate Uterine Fundus: firm Incision: n/a DVT Evaluation: No evidence of DVT seen on physical exam.  Recent Labs    07/22/19 0147  HGB 11.6*  HCT 37.2    Assessment/Plan: Discharge home and Contraception BTL Wants to go home early this afternoon after BTL   LOS: 1 day   Hansel Feinstein 07/23/2019, 6:28 AM

## 2019-07-23 NOTE — Progress Notes (Signed)
Post-Placental Nexplanon Insertion Procedure Note  Patient was identified. Informed consent was signed, signed copy in chart. A time-out was performed.    The insertion site was identified 8-10 cm (3-4 inches) from the medial epicondyle of the humerus and 3-5 cm (1.25-2 inches) posterior to (below) the sulcus (groove) between the biceps and triceps muscles of the patient's right  arm and marked. The site was prepped and draped in the usual sterile fashion. Pt was prepped with alcohol swab and then injected with 3 cc of 1% lidocaine. The site was prepped with betadine. Nexplanon removed form packaging,  Device confirmed in needle, then inserted full length of needle and withdrawn per handbook instructions. Provider and patient verified presence of the implant in the woman's arm by palpation. Pt insertion site was covered with steristrips/adhesive bandage and pressure bandage. There was minimal blood loss. Patient tolerated procedure well.  Patient was given post procedure instructions and Nexplanon user card with expiration date. Condoms were recommended for STI prevention. Patient was asked to keep the pressure dressing on for 24 hours to minimize bruising and keep the adhesive bandage on for 3-5 days. The patient verbalized understanding of the plan of care and agrees.   Lot # [id number] Expiration Date 08/30/2021

## 2019-07-23 NOTE — Lactation Note (Signed)
This note was copied from a baby's chart. Lactation Consultation Note  Patient Name: Michelle [name] Today's Date: 07/23/2019 Reason for consult: Follow-up assessment;Infant < 6lbs;Early term 37-38.6wks  LC in to check on P4 Mom, baby 61 hrs old and at 6% weight loss.  Baby is formula feeding by bottle with some breastfeeding .  Last feeding was 13 mins of breastfeeding after 30 ml of 22 cal formula.  Baby resting STS on FOB.  Baby starting to stir and offered to watch baby latch to breast to offer some assistance.  Mom reports some soreness on latch, but denies pinching or trauma to nipples.  Talked about a normal latch and watch it would look and feel like.   Mom declined LC assist/assess. Coconut oil given with instructions on use.   Mom encouraged to keep baby STS and feed at the breast with cues.  DEBP set up at bedside, but Mom states she will use her hand pump at home.  Reviewed supply and demand.   Mom aware of OP lactation support available.   Mom encouraged to call for a latch score at next feeding.    Interventions Interventions: Breast feeding basics reviewed;Skin to skin;Breast massage;Hand express;Hand pump coconut oil  Lactation Tools Discussed/Used Tools: Bottle   Consult Status Consult Status: Complete Date: 07/23/19 Follow-up type: Call as needed    Broadus John 07/23/2019, 8:56 AM

## 2019-07-23 NOTE — Progress Notes (Signed)
In to see patient in preparation for permanent sterilization procedure. Patient found lying comfortably in bed and expressing concerns for post op complications and emergency room visits. Reviewed procedure with patients along with risks and benefits of the procedure which include but are not limited to risks of bleeding, infection and damage to adjacent organs. Patient verbalized understanding and continued to express concerns regarding having a surgical procedure.  Patient is confident that she doesn't want any more children. Reviewed LARC options with the patient and she opted with Nexplanon prior to discharge. She may consider BTL in the distant future
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
## 2019-07-23 NOTE — Lactation Note (Deleted)
This note was copied from a baby's chart. Lactation Consultation Note  Patient Name: Michelle [name] Today's Date: 07/23/2019 Reason for consult: Follow-up assessment;Infant weight loss;Hyperbilirubinemia;Late-preterm 34-36.6wks;1st time breastfeeding;Primapara  Mom called out for help with positioning and latch.  Baby fussing a little, but once placed STS in football hold, baby opened wide and latched but was too sleepy to suck.  Tried a couple of times before left baby STS in the sunshine.  Mom to watch for cues and call for latch assist within the next hour.  Volume supplementation will be increased to 20-30 ml today.  Baby may need to learn paced feeding by bottle.  Parents understand jaundice, weight loss and prematurity can cause temporary sleepiness with breastfeeding.  Mom will continue her regular pumping.   Consult Status Consult Status: Follow-up Date: 07/24/19 Follow-up type: In-patient    Broadus John 07/23/2019, 9:24 AM

## 2019-08-19 ENCOUNTER — Encounter: Payer: Self-pay | Admitting: Obstetrics and Gynecology

## 2019-08-19 ENCOUNTER — Telehealth (INDEPENDENT_AMBULATORY_CARE_PROVIDER_SITE_OTHER): Payer: Medicaid Other | Admitting: Obstetrics and Gynecology

## 2019-08-19 ENCOUNTER — Other Ambulatory Visit: Payer: Self-pay

## 2019-08-19 DIAGNOSIS — Z975 Presence of (intrauterine) contraceptive device: Secondary | ICD-10-CM

## 2019-08-19 DIAGNOSIS — Z1389 Encounter for screening for other disorder: Secondary | ICD-10-CM | POA: Diagnosis not present

## 2019-08-19 NOTE — Progress Notes (Signed)
TELEHEALTH POSTPARTUM VIRTUAL VIDEO VISIT ENCOUNTER NOTE  Provider location: [facility] [place]   I connected with Jacquelyne Balint on 08/19/19 at  3:35 PM EST by MyChart Video Encounter at home and verified that I am speaking with the correct person using two identifiers.    I discussed the limitations, risks, security and privacy concerns of performing an evaluation and management service virtually and the availability of in person appointments. I also discussed with the patient that there may be a patient responsible charge related to this service. The patient expressed understanding and agreed to proceed.  Chief Complaint: Postpartum Visit  History of Present Illness: Michelle [name] is a 34 y.o. African-American [id number] being evaluated for postpartum followup.    She is s/p normal spontaneous vaginal delivery on 07/22/19 at 38 weeks; she was discharged to home on PPD#1. Pregnancy complicated by n/a. Baby is doing well.  Complains of nothing. She is doing well. Had some itching with Nexplanon at first but improved and now with no issues.  Vaginal bleeding or discharge: spotting Intercourse: No  Contraception: Nexplanon Mode of feeding infant: Bottle and Breast PP depression s/s: No .  Any bowel or bladder issues: No  Pap smear: no abnormalities (date: 03/2018)  Review of Systems: Positive for n/a. Her 12 point review of systems is negative or as noted in the History of Present Illness.  Patient Active Problem List   Diagnosis Date Noted  . Normal labor 07/22/2019  . Supervision of high risk pregnancy, antepartum 05/28/2019  . Late prenatal care 05/28/2019  . Short interval between pregnancies affecting pregnancy in third trimester, antepartum 05/28/2019  . History of trichomoniasis 05/28/2019  . History of gonorrhea 05/28/2019  . Family history of congenital anomalies 05/28/2018  . Uses marijuana 05/28/2018  . Social problem 05/27/2018  . Dental caries  05/27/2018   Medications [place] S. Metheny had no medications administered during this visit. Current Outpatient Medications  Medication Sig Dispense Refill  . acetaminophen (TYLENOL) 325 MG tablet Take 650 mg by mouth every 6 (six) hours as needed for mild pain.     Marland Kitchen etonogestrel (NEXPLANON) 68 MG IMPL implant 1 each by Subdermal route once.    Marland Kitchen ibuprofen (ADVIL) 600 MG tablet Take 1 tablet (600 mg total) by mouth every 6 (six) hours. 30 tablet 0  . prenatal vitamin w/FE, FA (PRENATAL 1 + 1) 27-1 MG TABS Take 1 tablet by mouth daily. 30 each 3   No current facility-administered medications for this visit.   Allergies Latex  Physical Exam:  LMP 10/03/2018 (Approximate)   Breastfeeding Yes   General:  Alert, oriented and cooperative. Patient is in no acute distress.  Mental Status: Normal mood and affect. Normal behavior. Normal judgment and thought content.   Respiratory: Normal respiratory effort noted, no problems with respiration noted  Rest of physical exam deferred due to type of encounter  PP Depression Screening:   Edinburgh Postnatal Depression Scale Screening Tool 08/19/2019 07/23/2019 07/22/2019 07/09/2018  I have been able to laugh and see the funny side of things. 0 0 (No Data) 1  I have looked forward with enjoyment to things. 0 0 - 0  I have blamed myself unnecessarily when things went wrong. 2 2 - 2  I have been anxious or worried for no good reason. 2 1 - 2  I have felt scared or panicky for no good reason. 2 2 - 0  Things have been getting on top of  me. 0 0 - 1  I have been so unhappy that I have had difficulty sleeping. 0 0 - 1  I have felt sad or miserable. 0 0 - 0  I have been so unhappy that I have been crying. 0 1 - 1  The thought of harming myself has occurred to me. 0 0 - 0  Edinburgh Postnatal Depression Scale Total 6 6 - 8    Assessment:Patient is a 34 y.o. [id number] who is 33 weeks postpartum from a normal spontaneous vaginal delivery. She is doing well.    Plan:  1. Postpartum state Doing well  2. Nexplanon in place Happy with nexplanon  RTC 6 months for annual  I discussed the assessment and treatment plan with the patient. The patient was provided an opportunity to ask questions and all were answered. The patient agreed with the plan and demonstrated an understanding of the instructions.   The patient was advised to call back or seek an in-person evaluation/go to the ED for any concerning postpartum symptoms.  I provided 10 minutes of face-to-face time during this encounter.   Sloan Leiter, MD [facility] [place]

## 2019-10-21 ENCOUNTER — Other Ambulatory Visit: Payer: Self-pay

## 2019-10-21 ENCOUNTER — Other Ambulatory Visit (HOSPITAL_COMMUNITY)
Admission: RE | Admit: 2019-10-21 | Discharge: 2019-10-21 | Disposition: A | Discharge status: Home / Self Care | Payer: Medicaid Other | Source: Ambulatory Visit | Attending: Obstetrics and Gynecology | Admitting: Obstetrics and Gynecology

## 2019-10-21 ENCOUNTER — Ambulatory Visit (INDEPENDENT_AMBULATORY_CARE_PROVIDER_SITE_OTHER): Payer: Medicaid Other | Admitting: Obstetrics and Gynecology

## 2019-10-21 VITALS — BP 121/72 | HR 83 | Wt 186.0 lb

## 2019-10-21 DIAGNOSIS — R079 Chest pain, unspecified: Secondary | ICD-10-CM

## 2019-10-21 DIAGNOSIS — Z975 Presence of (intrauterine) contraceptive device: Secondary | ICD-10-CM | POA: Diagnosis not present

## 2019-10-21 DIAGNOSIS — N939 Abnormal uterine and vaginal bleeding, unspecified: Secondary | ICD-10-CM | POA: Insufficient documentation

## 2019-10-21 NOTE — Progress Notes (Signed)
States she hasn't stop bleeding and is having arm pain and chest pain. Dev

## 2019-10-21 NOTE — Progress Notes (Signed)
Obstetrics and Gynecology Visit Return Patient Evaluation  Appointment Date: 10/21/2019  Primary Care Provider: Patient, No Pcp Per  OBGYN Clinic: [facility]  Chief Complaint: right arm/shoulder/chest pain since nexplanon was placed. aub  History of Present Illness:  Michelle [name] is a 34 y.o. with above CC. Pt had PP nexplanon placed after vag delivery in late December 2020. Pt has had aub since delivery and now it's spotting and before that was waxing and waning in intensity. Pap smear neg 2019. Pt stopped breast feeding about a month ago. Doesn't seem like she's had a period.   Review of Systems:  as noted in the History of Present Illness.  Patient Active Problem List   Diagnosis Date Noted  . Normal labor 07/22/2019  . Supervision of high risk pregnancy, antepartum 05/28/2019  . Late prenatal care 05/28/2019  . Short interval between pregnancies affecting pregnancy in third trimester, antepartum 05/28/2019  . History of trichomoniasis 05/28/2019  . History of gonorrhea 05/28/2019  . Family history of congenital anomalies 05/28/2018  . Uses marijuana 05/28/2018  . Social problem 05/27/2018  . Dental caries 05/27/2018   Medications:  [place] S. Mensinger had no medications administered during this visit. Current Outpatient Medications  Medication Sig Dispense Refill  . acetaminophen (TYLENOL) 325 MG tablet Take 650 mg by mouth every 6 (six) hours as needed for mild pain.     Marland Kitchen etonogestrel (NEXPLANON) 68 MG IMPL implant 1 each by Subdermal route once.    Marland Kitchen ibuprofen (ADVIL) 600 MG tablet Take 1 tablet (600 mg total) by mouth every 6 (six) hours. (Patient not taking: Reported on 10/21/2019) 30 tablet 0  . prenatal vitamin w/FE, FA (PRENATAL 1 + 1) 27-1 MG TABS Take 1 tablet by mouth daily. (Patient not taking: Reported on 10/21/2019) 30 each 3   No current facility-administered medications for this visit.    Allergies: is allergic to latex.  Physical  Exam:  BP 121/72   Pulse 83   Wt 186 lb (84.4 kg)   BMI 31.93 kg/m  Body mass index is 31.93 kg/m. General appearance: Well nourished, well developed female in no acute distress.  Ext: RUE with nexplanon easily palpable near insertion site Abdomen: diffusely non tender to palpation, non distended, and no masses, hernias Neuro/Psych:  Normal mood and affect.    Pelvic exam:  EGBUS: neg Vaginal vault: no blood, brownish d/c in vault Cervix: normal Bimanual: negative   Assessment: pt stable  Plan:  1. Abnormal uterine bleeding (AUB) Unsure if due to nexplanon or postpartum. Basic labs today and d/w her that if negative, I'd try a month or two of OCPs to see if that will help before nexplaon removal - CBC - TSH - Beta hCG quant (ref lab) - Cervicovaginal ancillary only  2. Chest pain, unspecified type I don't think this is due to the nexplanon. Will get cxr - DG Chest 2 View; Future  3. Nexplanon in place   RTC: will d/w pt once results are back  Cornelia Copa MD Attending [facility])

## 2019-10-22 LAB — CERVICOVAGINAL ANCILLARY ONLY
Bacterial Vaginitis (gardnerella): POSITIVE — AB
Candida Glabrata: NEGATIVE
Candida Vaginitis: NEGATIVE
Chlamydia: NEGATIVE
Comment: NEGATIVE
Comment: NEGATIVE
Comment: NEGATIVE
Comment: NEGATIVE
Comment: NEGATIVE
Comment: NORMAL
Neisseria Gonorrhea: NEGATIVE
Trichomonas: NEGATIVE

## 2019-10-22 LAB — CBC
Hematocrit: 39 % (ref 34.0–46.6)
Hemoglobin: 12.7 g/dL (ref 11.1–15.9)
MCH: 28 pg (ref 26.6–33.0)
MCHC: 32.6 g/dL (ref 31.5–35.7)
MCV: 86 fL (ref 79–97)
Platelets: 417 10*3/uL (ref 150–450)
RBC: 4.53 x10E6/uL (ref 3.77–5.28)
RDW: 14.7 % (ref 11.7–15.4)
WBC: 6.3 10*3/uL (ref 3.4–10.8)

## 2019-10-22 LAB — BETA HCG QUANT (REF LAB): hCG Quant: <1 m[IU]/mL

## 2019-10-22 LAB — TSH: TSH: 0.972 u[IU]/mL (ref 0.450–4.500)

## 2019-10-25 MED ORDER — METRONIDAZOLE 500 MG PO TABS: 500.0000 mg | ORAL_TABLET | Freq: Two times a day (BID) | ORAL | 0 refills | Status: DC

## 2019-10-25 NOTE — Addendum Note (Signed)
Addended by: [place] on: 10/25/2019 07:23 PM   Modules accepted: Orders

## 2020-06-03 ENCOUNTER — Other Ambulatory Visit: Payer: Medicaid Other

## 2020-06-03 ENCOUNTER — Other Ambulatory Visit: Payer: Self-pay

## 2020-06-03 DIAGNOSIS — Z20822 Contact with and (suspected) exposure to covid-19: Secondary | ICD-10-CM

## 2020-06-04 LAB — SARS-COV-2, NAA 2 DAY TAT

## 2020-06-04 LAB — NOVEL CORONAVIRUS, NAA: SARS-CoV-2, NAA: NOT DETECTED

## 2020-12-05 IMAGING — US US MFM OB DETAIL+14 WK
1 series · 13 of 28 positions shown · non-contrast
Comparison: none

[Series 1: us mfm ob detail+14 wk · 13 of 99 slices shown]
[im 4/99]
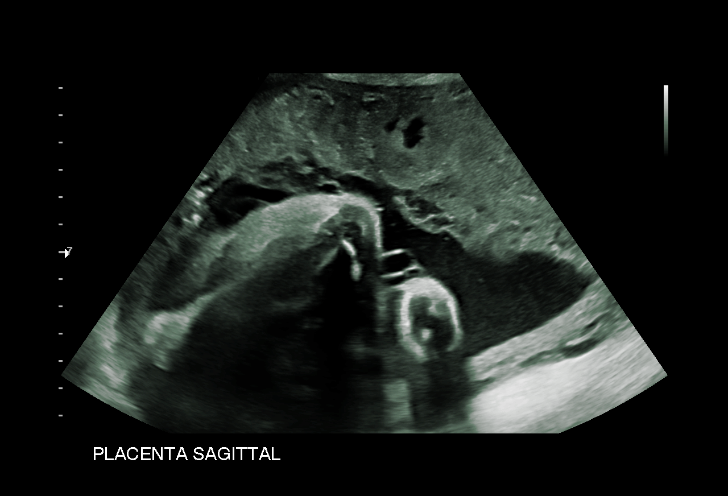
[im 11/99]
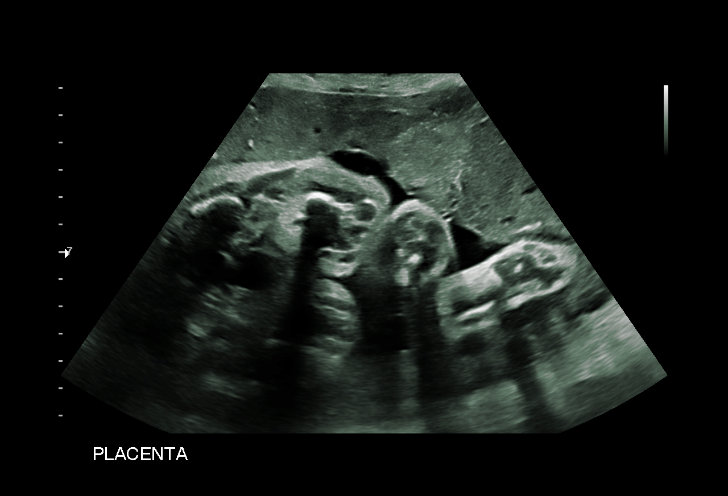
[im 19/99]
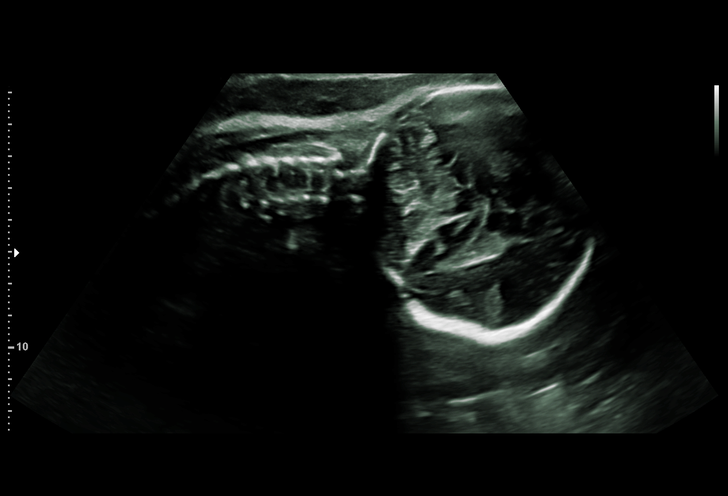
[im 26/99]
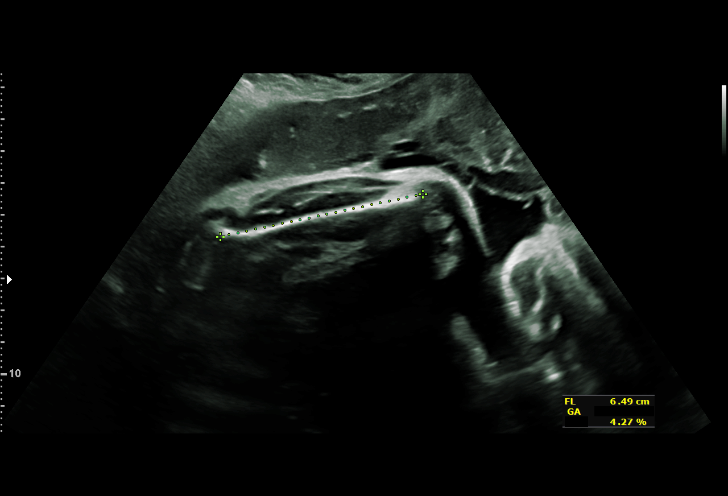
[im 33/99]
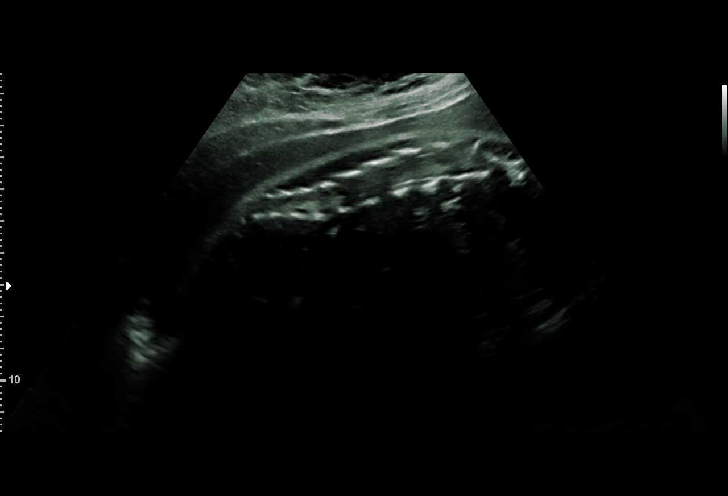
[im 40/99]
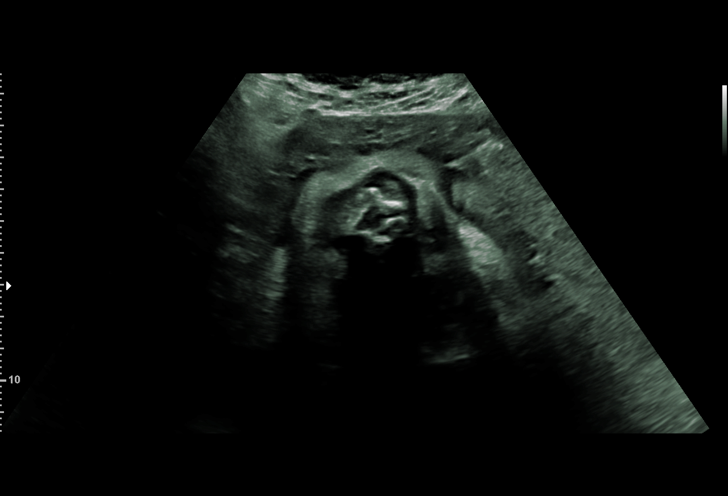
[im 51/99]
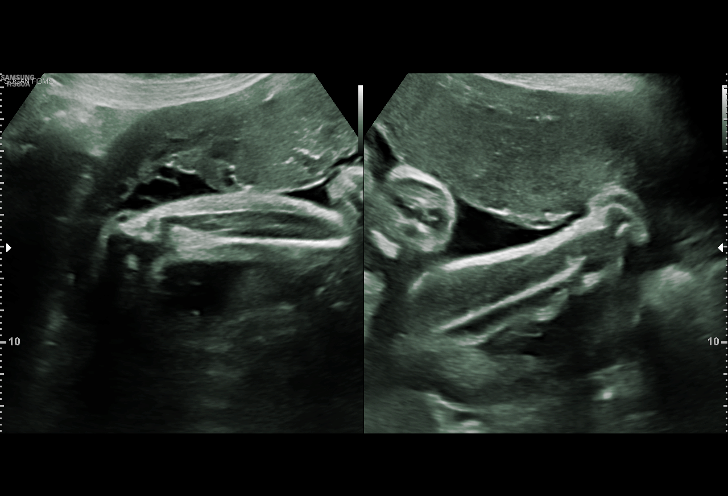
[im 59/99]
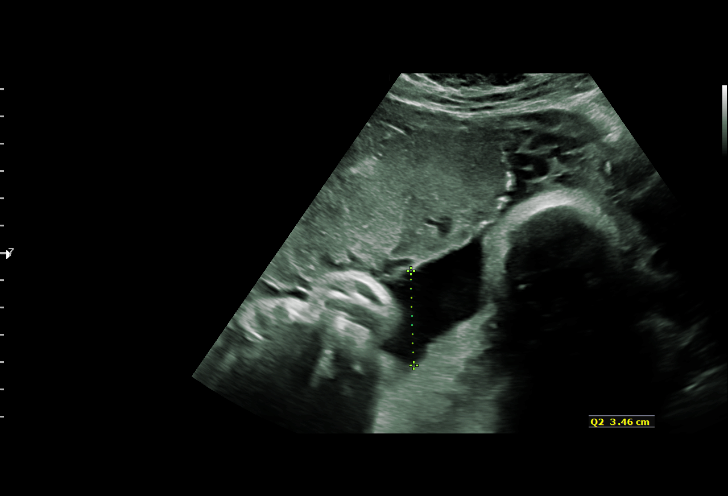
[im 66/99]
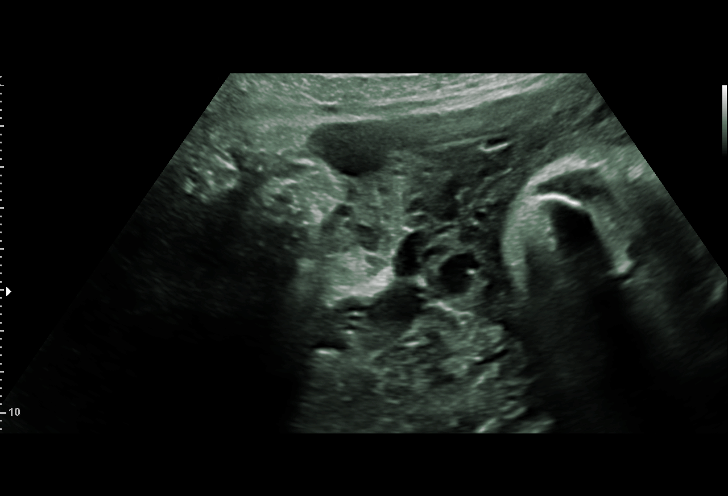
[im 73/99]
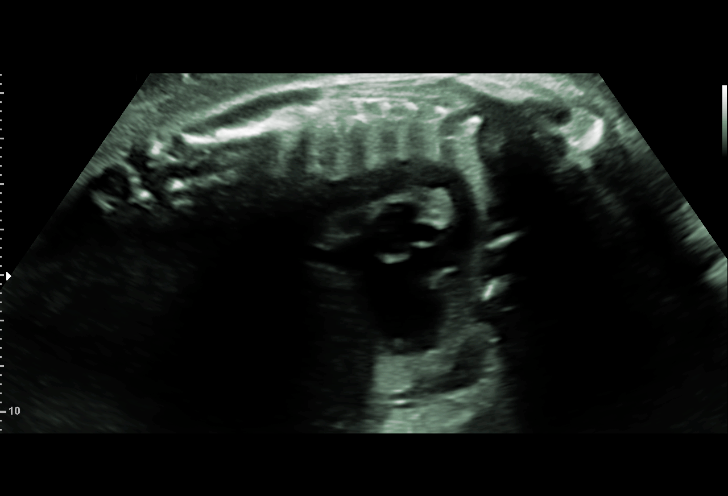
[im 80/99]
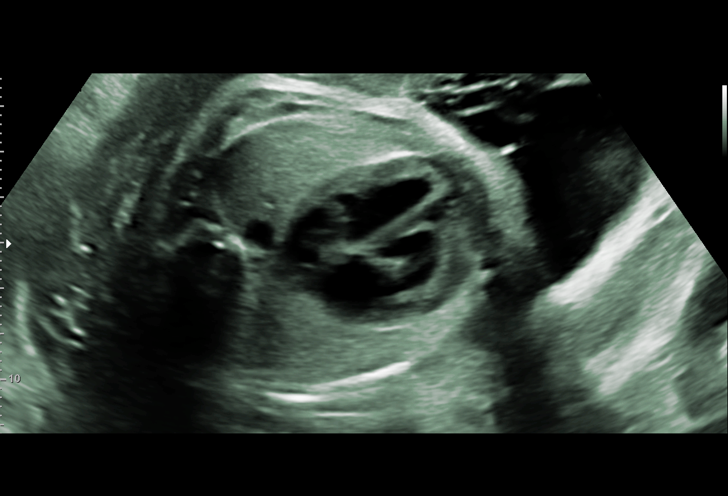
[im 88/99]
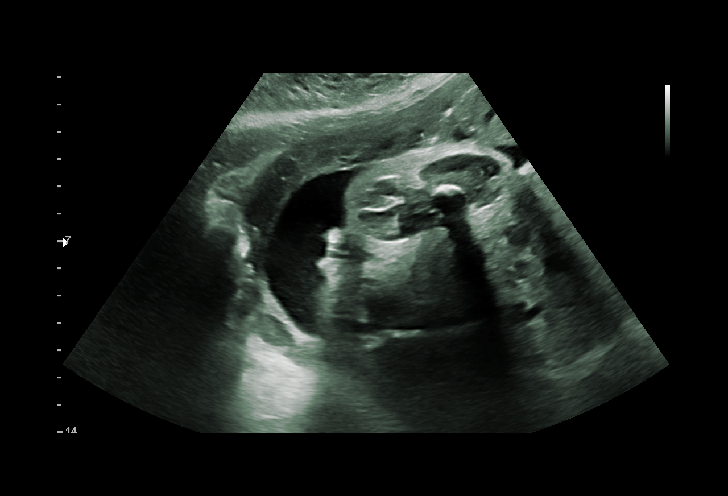
[im 95/99]
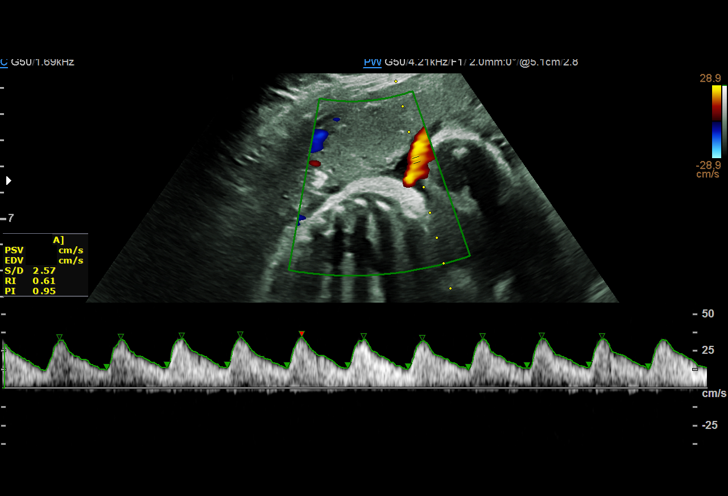

[13 of 28 positions shown; findings below may reference images not displayed]

[address]

  2  US MFM UA CORD DOPPLER               76820.02     NAZARETH JUMPER
 ----------------------------------------------------------------------

 ----------------------------------------------------------------------
Indications

  Encounter for antenatal screening for
  malformations
  Insufficient Prenatal Care
  Poor obstetric history: Previous fetal growth
  restriction (FGR)
  Family history of congenital anomaly
  Marijuana Use
  Short interval between pregancies, 3rd
  trimester
  32 weeks gestation of pregnancy
  Small for gestational age fetus affecting
  management of mother
  Poor obstetric history: Previous fetal growth
  restriction (FGR)
 ----------------------------------------------------------------------
Fetal Evaluation

 Num Of Fetuses:          1
 Fetal Heart Rate(bpm):   137
 Cardiac Activity:        Observed
 Presentation:            Cephalic
 Placenta:                Anterior
 P. Cord Insertion:       Visualized, central

 Amniotic Fluid
 AFI FV:      Within normal limits

 AFI Sum(cm)     %Tile       Largest Pocket(cm)
 13.46           43

 RUQ(cm)       RLQ(cm)       LUQ(cm)        LLQ(cm)

Biometry

 BPD:      78.8  mm     G. Age:  31w 4d         26  %    CI:        74.36   %    70 - 86
                                                         FL/HC:       22.3  %    19.1 -
 HC:      290.1  mm     G. Age:  32w 0d         12  %    HC/AC:       1.06       0.96 -
 AC:      272.8  mm     G. Age:  31w 3d         26  %    FL/BPD:      82.1  %    71 - 87
 FL:       64.7  mm     G. Age:  33w 3d         71  %    FL/AC:       23.7  %    20 - 24
 HUM:      55.1  mm     G. Age:  32w 0d         50  %
 CER:      45.8  mm     G. Age:  N/A          > 95  %

 LV:          6  mm

 Est. FW:    0221   gm     4 lb 3 oz     36  %
OB History

 Gravidity:    4         Term:   3
 Living:       3
Gestational Age

 Clinical EDD:  35w 5d                                        EDD:   07/10/19
 U/S Today:     32w 1d                                        EDD:   08/04/19
 Best:          32w 1d     Det. By:  U/S (06/10/19)           EDD:   08/04/19
Anatomy

 Cranium:               Appears normal         LVOT:                   Appears normal
 Cavum:                 Not well visualized    Aortic Arch:            Appears normal
 Ventricles:            Appears normal         Ductal Arch:            Appears normal
 Choroid Plexus:        Appears normal         Diaphragm:              Appears normal
 Cerebellum:            Appears normal         Stomach:                Appears normal, left
                                                                       sided
 Posterior Fossa:       Appears normal         Abdomen:                Appears normal
 Nuchal Fold:           Not applicable (>20    Abdominal Wall:         Appears nml (cord
                        wks GA)                                        insert, abd wall)
 Face:                  Orbits appear          Cord Vessels:           Appears normal (3
                        normal                                         vessel cord)
 Lips:                  Appears normal         Kidneys:                Appear normal
 Palate:                Appears normal         Bladder:                Appears normal
 Thoracic:              Appears normal         Spine:                  Appears normal
 Heart:                 Appears normal         Upper Extremities:      Appears normal
                        (4CH, axis, and
                        situs)
 RVOT:                  Appears normal         Lower Extremities:      Appears normal

 Other:  Technically difficult due to advanced GA and fetal position. Female
         gender
Doppler - Fetal Vessels

 Umbilical Artery
  S/D     %tile     RI              PI                     ADFV    RDFV
 2.89       77   0.65             1.03                        No      No

Cervix Uterus Adnexa

 Cervix
 Not visualized (advanced GA >49wks)
 Left Ovary
 Within normal limits.

 Right Ovary
 Within normal limits.
Impression

 Normal interval growth.  No ultrasonic evidence of structural
 fetal anomalies.
 Suboptimal views of the fetal anatomy was obtained
 secondary to fetal position.
 We dated the pregnancy by today's examination given that
 Ms. Amankul Gusev was on 07/10/19 with today's US given her
 an EDD of 08/04/2019 which is >21 days. She became
 pregnant while still breast feeding her period was irregular. At
 this time we plan to date the pregnancy by today's
 examination. However, I explaind that given she had a prior
 infant with IUGR. Her current dating of 07/10/19 would result
 in an IUGR fetus.  Normal UA Dopplers.
Recommendations

 Follow up growth in 3 weeks.

## 2021-11-06 ENCOUNTER — Ambulatory Visit (INDEPENDENT_AMBULATORY_CARE_PROVIDER_SITE_OTHER): Payer: Medicaid Other | Admitting: Primary Care

## 2021-11-06 ENCOUNTER — Encounter (INDEPENDENT_AMBULATORY_CARE_PROVIDER_SITE_OTHER): Payer: Self-pay | Admitting: Primary Care

## 2021-11-06 ENCOUNTER — Other Ambulatory Visit: Payer: Self-pay

## 2021-11-06 VITALS — BP 103/68 | HR 63 | Temp 98.3°F | Ht 69.0 in | Wt 230.8 lb

## 2021-11-06 DIAGNOSIS — E66812 Obesity, class 2: Secondary | ICD-10-CM

## 2021-11-06 DIAGNOSIS — Z862 Personal history of diseases of the blood and blood-forming organs and certain disorders involving the immune mechanism: Secondary | ICD-10-CM

## 2021-11-06 DIAGNOSIS — S6991XA Unspecified injury of right wrist, hand and finger(s), initial encounter: Secondary | ICD-10-CM | POA: Diagnosis not present

## 2021-11-06 DIAGNOSIS — N921 Excessive and frequent menstruation with irregular cycle: Secondary | ICD-10-CM | POA: Diagnosis not present

## 2021-11-06 DIAGNOSIS — Z7689 Persons encountering health services in other specified circumstances: Secondary | ICD-10-CM

## 2021-11-06 DIAGNOSIS — Z6834 Body mass index (BMI) 34.0-34.9, adult: Secondary | ICD-10-CM

## 2021-11-06 DIAGNOSIS — Z131 Encounter for screening for diabetes mellitus: Secondary | ICD-10-CM
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
## 2021-11-07 ENCOUNTER — Encounter (INDEPENDENT_AMBULATORY_CARE_PROVIDER_SITE_OTHER): Payer: Self-pay | Admitting: Primary Care

## 2021-11-07 LAB — CBC WITH DIFFERENTIAL/PLATELET
Basophils Absolute: 0.1 10*3/uL (ref 0.0–0.2)
Basos: 1 %
EOS (ABSOLUTE): 0.3 10*3/uL (ref 0.0–0.4)
Eos: 4 %
Hematocrit: 30.8 % — ABNORMAL LOW (ref 34.0–46.6)
Hemoglobin: 8.3 g/dL — ABNORMAL LOW (ref 11.1–15.9)
Immature Grans (Abs): 0 10*3/uL (ref 0.0–0.1)
Immature Granulocytes: 0 %
Lymphocytes Absolute: 3.1 10*3/uL (ref 0.7–3.1)
Lymphs: 39 %
MCH: 17.8 pg — ABNORMAL LOW (ref 26.6–33.0)
MCHC: 26.9 g/dL — ABNORMAL LOW (ref 31.5–35.7)
MCV: 66 fL — ABNORMAL LOW (ref 79–97)
Monocytes Absolute: 0.7 10*3/uL (ref 0.1–0.9)
Monocytes: 9 %
Neutrophils Absolute: 3.8 10*3/uL (ref 1.4–7.0)
Neutrophils: 47 %
Platelets: 585 10*3/uL — ABNORMAL HIGH (ref 150–450)
RBC: 4.65 x10E6/uL (ref 3.77–5.28)
RDW: 18.7 % — ABNORMAL HIGH (ref 11.7–15.4)
WBC: 8 10*3/uL (ref 3.4–10.8)

## 2021-11-07 LAB — CMP14+EGFR
ALT: 20 IU/L (ref 0–32)
AST: 20 IU/L (ref 0–40)
Albumin/Globulin Ratio: 1.3 (ref 1.2–2.2)
Albumin: 4.3 g/dL (ref 3.8–4.8)
Alkaline Phosphatase: 113 IU/L (ref 44–121)
BUN/Creatinine Ratio: 6 — ABNORMAL LOW (ref 9–23)
BUN: 5 mg/dL — ABNORMAL LOW (ref 6–20)
Bilirubin Total: 0.2 mg/dL (ref 0.0–1.2)
CO2: 22 mmol/L (ref 20–29)
Calcium: 9.7 mg/dL (ref 8.7–10.2)
Chloride: 102 mmol/L (ref 96–106)
Creatinine, Ser: 0.77 mg/dL (ref 0.57–1.00)
Globulin, Total: 3.3 g/dL (ref 1.5–4.5)
Glucose: 86 mg/dL (ref 70–99)
Potassium: 4.7 mmol/L (ref 3.5–5.2)
Sodium: 139 mmol/L (ref 134–144)
Total Protein: 7.6 g/dL (ref 6.0–8.5)
eGFR: 104 mL/min/{1.73_m2} (ref >59)

## 2021-11-07 LAB — TSH+FREE T4
Free T4: 0.95 ng/dL (ref 0.82–1.77)
TSH: 0.648 u[IU]/mL (ref 0.450–4.500)

## 2021-11-07 LAB — HEMOGLOBIN A1C
Est. average glucose Bld gHb Est-mCnc: 111 mg/dL
Hgb A1c MFr Bld: 5.5 % (ref 4.8–5.6)

## 2021-11-08 ENCOUNTER — Telehealth (INDEPENDENT_AMBULATORY_CARE_PROVIDER_SITE_OTHER): Payer: Self-pay | Admitting: Primary Care

## 2021-11-08 NOTE — Telephone Encounter (Signed)
Routed to PCP. She just attempted to reach patient. Sharyn Lull, patient calling back about results.  ?

## 2021-11-08 NOTE — Telephone Encounter (Signed)
Copied from CRM [id number]. Topic: General - Other ?>> Nov 07, 2021  3:53 PM Wyonia Hough E wrote: ?Reason for CRM: Pt would like a call to go over lab results/ she doesn't understand them on mychart / please advise ?

## 2021-11-08 NOTE — Telephone Encounter (Signed)
Call patient to inform you have iron deficiency anemia. Your red blood cells carry oxygen, you are  anemic . You need to be taking iron supplement. Iron (FESO4) 325 mg daily. This may cause constipation so increase your fiber and  may want to get senokot S for stool softener and laxative We will continue to monitor this.   ?

## 2021-11-16 ENCOUNTER — Ambulatory Visit: Payer: Medicaid Other | Admitting: Orthopedic Surgery

## 2021-11-28 ENCOUNTER — Encounter: Payer: Self-pay | Admitting: Orthopedic Surgery

## 2021-11-28 ENCOUNTER — Ambulatory Visit (INDEPENDENT_AMBULATORY_CARE_PROVIDER_SITE_OTHER): Payer: Medicaid Other | Admitting: Orthopedic Surgery

## 2021-11-28 ENCOUNTER — Ambulatory Visit: Payer: Self-pay

## 2021-11-28 VITALS — BP 106/63 | HR 81 | Ht 69.0 in | Wt 231.0 lb

## 2021-11-28 DIAGNOSIS — M79644 Pain in right finger(s): Secondary | ICD-10-CM | POA: Diagnosis not present

## 2021-11-28 DIAGNOSIS — G8929 Other chronic pain: Secondary | ICD-10-CM | POA: Insufficient documentation

## 2021-11-28 MED ORDER — MELOXICAM 7.5 MG PO TABS: 7.5000 mg | ORAL_TABLET | Freq: Every day | ORAL | 0 refills | Status: AC
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
## 2021-12-04 ENCOUNTER — Encounter: Payer: Medicaid Other | Admitting: Occupational Therapy

## 2021-12-04 ENCOUNTER — Ambulatory Visit: Payer: Medicaid Other | Admitting: Occupational Therapy

## 2021-12-06 ENCOUNTER — Encounter: Payer: Medicaid Other | Admitting: Occupational Therapy

## 2021-12-07 MED ORDER — IRON (FERROUS SULFATE) 325 (65 FE) MG PO TABS: 325.0000 mg | ORAL_TABLET | Freq: Every day | ORAL | 1 refills | Status: DC

## 2021-12-19 ENCOUNTER — Ambulatory Visit: Payer: Medicaid Other | Admitting: Occupational Therapy

## 2021-12-20 ENCOUNTER — Telehealth: Payer: Self-pay | Admitting: Family Medicine

## 2021-12-20 NOTE — Telephone Encounter (Signed)
Called to schedule appt for July ?

## 2022-03-08 ENCOUNTER — Ambulatory Visit: Payer: Medicaid Other | Admitting: Family Medicine

## 2022-05-01 ENCOUNTER — Ambulatory Visit (INDEPENDENT_AMBULATORY_CARE_PROVIDER_SITE_OTHER): Payer: Medicaid Other

## 2022-05-01 VITALS — BP 107/82 | HR 86 | Ht 64.0 in | Wt 215.1 lb

## 2022-05-01 DIAGNOSIS — Z3046 Encounter for surveillance of implantable subdermal contraceptive: Secondary | ICD-10-CM | POA: Diagnosis not present

## 2022-05-01 NOTE — Progress Notes (Signed)
   OFFICE PROCEDURE NOTE  History:  Michelle [name] is a 36 y.o. [phone] here today for Nexplanon removal. Patient reports prolonged bleeding and desires removal.  She declines other birth control method initiation.   Past Medical History:  Diagnosis Date   Anemia    On Iron Supplements   Anxiety    Chlamydia    Insomnia     Past Surgical History:  Procedure Laterality Date   EYE SURGERY Left     The following portions of the patient's history were reviewed and updated as appropriate: allergies, current medications, past family history, past medical history, past social history, past surgical history and problem list.   Health Maintenance:  Normal pap and negative HRHPV on 2019.  No mammogram on file d/t age.   Review of Systems:  ROS  Objective:  Vitals: BP 107/82   Pulse 86   Ht 5\' 4"  (1.626 m)   Wt 215 lb 1.6 oz (97.6 kg)   LMP 04/06/2022 (Approximate)   BMI 36.92 kg/m   Physical Exam  Procedure"    Nexplanon Removal Procedure:  Michelle [name] requests removal of Nexplanon for DUB.  She understands that without another form of birth control method pregnancy can ensue immediately after removal. Patient also understands that removal can result in blood loss, infection at removal site, and pain.  Patient verbalizes understanding for all risks as stated above and in the consent and desires to proceed with removal.   Appropriate time out taken.  Patient identified, informed consent performed, consent signed.  Nexplanon site identified; left arm.  Area prepped in usual sterile fashon. 43ml of 1% lidocaine with epi was used to anesthetize the area at the posterior end of the implant. A small stab incision was made right beside the implant on the posterior portion.  The Nexplanon rod was visualized within the sheath. The sheath was gently removed with the scalpel, resulting in the Nexplanon ejecting from the site without difficulty. There was minimal blood loss and no  complications.  Steri-strips were applied over the small incision.  A pressure bandage was applied to reduce any bruising.   The patient tolerated the procedure well and was given post procedure instructions to include: *Remove of pressure dressing and band-aid in 12-24 hours. *Remove of steri-strips after no more than 7 days. *Condom usage encouraged as patient does not desire contraception at this time. *Tylenol or ibuprofen for insertion pain/discomfort. *Call for any other questions, concerns, or complications.   Assessment & Plan:  36  year old Nexplanon removal   -Nexplanon removed without issues.  -Discussed need to return for annual exam with pap smear.   Maryann Conners MSN, CNM 05/01/2022

## 2022-05-01 NOTE — Progress Notes (Signed)
Patient presents for AEX and Nexplanon removal.  Last Pap: 03/13/2018 - normal, declines today d/t vaginal bleeding  Last Mammogram: Not due, no breast concerns  Contraception: Nexplanon, inserted 2020- desires removal today. Does not desire any birth control  Vaginal/Urinary Symptoms: Denies  STD Screen: Declines  Other Concerns: prolonged periods with Nexplanon

## 2022-07-09 ENCOUNTER — Ambulatory Visit: Payer: Medicaid Other | Admitting: Advanced Practice Midwife

## 2022-11-19 ENCOUNTER — Emergency Department (HOSPITAL_COMMUNITY): Payer: Medicaid Other

## 2022-11-19 ENCOUNTER — Emergency Department (HOSPITAL_COMMUNITY)
Admission: EM | Admit: 2022-11-19 | Discharge: 2022-11-19 | Disposition: A | Discharge status: Home / Self Care | Payer: Medicaid Other | Attending: Emergency Medicine | Admitting: Emergency Medicine

## 2022-11-19 ENCOUNTER — Other Ambulatory Visit: Payer: Self-pay

## 2022-11-19 ENCOUNTER — Encounter (HOSPITAL_COMMUNITY): Payer: Self-pay | Admitting: Emergency Medicine

## 2022-11-19 DIAGNOSIS — Z91040 Latex allergy status: Secondary | ICD-10-CM | POA: Insufficient documentation

## 2022-11-19 DIAGNOSIS — R0789 Other chest pain: Secondary | ICD-10-CM | POA: Diagnosis present

## 2022-11-19 DIAGNOSIS — R079 Chest pain, unspecified: Secondary | ICD-10-CM

## 2022-11-19 LAB — CBC
HCT: 33.3 % — ABNORMAL LOW (ref 36.0–46.0)
Hemoglobin: 9.3 g/dL — ABNORMAL LOW (ref 12.0–15.0)
MCH: 19.8 pg — ABNORMAL LOW (ref 26.0–34.0)
MCHC: 27.9 g/dL — ABNORMAL LOW (ref 30.0–36.0)
MCV: 70.9 fL — ABNORMAL LOW (ref 80.0–100.0)
Platelets: 460 10*3/uL — ABNORMAL HIGH (ref 150–400)
RBC: 4.7 MIL/uL (ref 3.87–5.11)
RDW: 20.6 % — ABNORMAL HIGH (ref 11.5–15.5)
WBC: 8.9 10*3/uL (ref 4.0–10.5)
nRBC: 0 % (ref 0.0–0.2)

## 2022-11-19 LAB — I-STAT BETA HCG BLOOD, ED (MC, WL, AP ONLY): I-stat hCG, quantitative: <5 m[IU]/mL (ref <5)

## 2022-11-19 LAB — BASIC METABOLIC PANEL
Anion gap: 14 (ref 5–15)
BUN: 5 mg/dL — ABNORMAL LOW (ref 6–20)
CO2: 23 mmol/L (ref 22–32)
Calcium: 9.3 mg/dL (ref 8.9–10.3)
Chloride: 101 mmol/L (ref 98–111)
Creatinine, Ser: 0.72 mg/dL (ref 0.44–1.00)
GFR, Estimated: >60 mL/min (ref >60)
Glucose, Bld: 90 mg/dL (ref 70–99)
Potassium: 3.6 mmol/L (ref 3.5–5.1)
Sodium: 138 mmol/L (ref 135–145)

## 2022-11-19 LAB — TROPONIN I (HIGH SENSITIVITY): Troponin I (High Sensitivity): 3 ng/L (ref <18)

## 2022-11-19 MED ORDER — IBUPROFEN 400 MG PO TABS
600.0000 mg | ORAL_TABLET | Freq: Once | ORAL | Status: AC
  Administered 2022-11-19: 600 mg via ORAL
  Filled 2022-11-19: qty 1

## 2022-11-19 MED ORDER — NAPROXEN 500 MG PO TABS: 500.0000 mg | ORAL_TABLET | Freq: Two times a day (BID) | ORAL | 0 refills | Status: DC

## 2022-11-19 NOTE — ED Notes (Signed)
Patient transported to XR. 

## 2022-11-19 NOTE — Discharge Instructions (Addendum)
Please check MyChart for your lab results.  If the troponin level is abnormal please return to the ER.  I also recommend follow-up with your primary care physician for further evaluation and management.

## 2022-11-19 NOTE — ED Triage Notes (Signed)
Pt BIB EMS from home for CP, persistent for 1 month. Chronic back pain that radiates to chest with inspiration. States the pain was worse today.  EMS VS:  138/70 HR 68 98% RA RR 16

## 2022-11-19 NOTE — ED Notes (Signed)
Patient stated after her labs she is leaving. Patient has family factors playing into this decision. EDP also explained to patient the risks of leaving AMA. Patient is aware and will come back if her labs show anything.

## 2022-11-19 NOTE — ED Notes (Signed)
This NT attempted to get an EKG on Pt, however Pt refused any further testing and request to leave. MD and Nurse notified.

## 2022-11-19 NOTE — ED Provider Notes (Signed)
[place] EMERGENCY DEPARTMENT AT [facility] Provider Note   CSN: [id number] Arrival date & time: 11/19/22  1830     History  Chief Complaint  Patient presents with   Chest Pain    Michelle [name] is a 37 y.o. female with a past medical history of anxiety presents today for evaluation of chest pain.  Patient states she has had left-sided chest pain for the last few months.  The pain is located in the chest, intermittent, reproducible.  She only has chest pain when she pressed on her chest.  No fever, breath, nausea, vomiting, bowel changes, urinary symptoms.  No recent trauma, surgery or travel.  No history of DVT/PE, personal or family.  She also reports tingling and numbness on her left hand intermittently.   Chest Pain     Past Medical History:  Diagnosis Date   Anemia    On Iron Supplements   Anxiety    Chlamydia    Insomnia    Past Surgical History:  Procedure Laterality Date   EYE SURGERY Left      Home Medications Prior to Admission medications   Medication Sig Start Date End Date Taking? Authorizing Provider  acetaminophen (TYLENOL) 325 MG tablet Take 650 mg by mouth every 6 (six) hours as needed for mild pain.     [provider]  etonogestrel (NEXPLANON) 68 MG IMPL implant 1 each by Subdermal route once.    [provider]  Iron, Ferrous Sulfate, 325 (65 Fe) MG TABS Take 325 mg by mouth daily. 12/07/21   Grayce Sessions, NP      Allergies    Latex    Review of Systems   Review of Systems  Cardiovascular:  Positive for chest pain.    Physical Exam Updated Vital Signs BP 116/60 (BP Location: Right Arm)   Pulse 71   Temp 98.4 F (36.9 C) (Oral)   Resp 17   Ht 5\' 4"  (1.626 m)   Wt 97.6 kg   SpO2 100%   BMI 36.93 kg/m  Physical Exam Vitals and nursing note reviewed.  Constitutional:      Appearance: Normal appearance.  HENT:     Head: Normocephalic and atraumatic.     Mouth/Throat:     Mouth: Mucous  membranes are moist.  Eyes:     General: No scleral icterus. Cardiovascular:     Rate and Rhythm: Normal rate and regular rhythm.     Pulses: Normal pulses.     Heart sounds: Normal heart sounds.  Pulmonary:     Effort: Pulmonary effort is normal.     Breath sounds: Normal breath sounds.  Chest:     Chest wall: Tenderness (L side chest wall) present.  Abdominal:     General: Abdomen is flat.     Palpations: Abdomen is soft.     Tenderness: There is no abdominal tenderness.  Musculoskeletal:        General: No deformity.  Skin:    General: Skin is warm.     Findings: No rash.  Neurological:     General: No focal deficit present.     Mental Status: She is alert.  Psychiatric:        Mood and Affect: Mood normal.     ED Results / Procedures / Treatments   Labs (all labs ordered are listed, but only abnormal results are displayed) Labs Reviewed  BASIC METABOLIC PANEL  CBC  TROPONIN I (HIGH SENSITIVITY)    EKG  EKG Interpretation  Date/Time:  Monday November 19 2022 18:53:08 EDT Ventricular Rate:  63 PR Interval:  135 QRS Duration: 95 QT Interval:  419 QTC Calculation: 429 R Axis:   9 Text Interpretation: Sinus rhythm Confirmed by Vivien Rossetti ([id number]) on 11/19/2022 6:54:11 PM  Radiology No results found.  Procedures Procedures    Medications Ordered in ED Medications  ibuprofen (ADVIL) tablet 600 mg (has no administration in time range)    ED Course/ Medical Decision Making/ A&P                             Medical Decision Making Amount and/or Complexity of Data Reviewed Labs: ordered. Radiology: ordered.   This patient presents to the ED for chest pain, this involves an extensive number of treatment options, and is a complaint that carries with a high risk of complications and morbidity.  The differential diagnosis includes ACS, pericarditis, PE, pneumothorax, pneumonia, dissection. This is not an exhaustive list.  Lab tests: CBC, BMP, troponin  pending.  Imaging studies: I ordered imaging studies. I personally reviewed, interpreted imaging and agree with the radiologist's interpretations. The results include: Strain normal.  Problem list/ ED course/ Critical interventions/ Medical management: HPI: See above Vital signs within normal range and stable throughout visit. Laboratory/imaging studies significant for: See above. On physical examination, patient is afebrile and appears in no acute distress. Exam without evidence of volume overload so doubt heart failure. EKG without signs of active ischemia. Given pain is reproducible with palpation to the left chest wall, suspect muscular in nature, doubt NSTEMI. Presentation not consistent with acute PE ([place] low risk), pneumothorax (not visualized on chest xr), thoracic aortic dissection, pericarditis, tamponade, pneumonia (no infectious symptoms, clear chest xr), myocarditis (no recent illness).  Advised patient to stay and wait for her labs to return however patient is short on time and expressed desire to leave before labs return. Workup with chest x-ray and EKG are normal.  Given symptoms are consistent with muscular pain in nature, I do not think there is any emergency at this point.  HEART score:1 so plan to discharge patient home with PMD follow up.  I have reviewed the patient home medicines and have made adjustments as needed.  Cardiac monitoring/EKG: The patient was maintained on a cardiac monitor.  I personally reviewed and interpreted the cardiac monitor which showed an underlying rhythm of: sinus rhythm.  Additional history obtained: External records from outside source obtained and reviewed including: Chart review including previous notes, labs, imaging.  Consultations obtained:  Disposition Continued outpatient therapy. Follow-up with PCP recommended for reevaluation of symptoms. Treatment plan discussed with patient.  Pt acknowledged understanding was agreeable to the plan.  Worrisome signs and symptoms were discussed with patient, and patient acknowledged understanding to return to the ED if they noticed these signs and symptoms. Patient was stable upon discharge.   This chart was dictated using voice recognition software.  Despite best efforts to proofread,  errors can occur which can change the documentation meaning.          Final Clinical Impression(s) / ED Diagnoses Final diagnoses:  Chest pain, unspecified type    Rx / DC Orders ED Discharge Orders          Ordered    naproxen (NAPROSYN) 500 MG tablet  2 times daily        11/19/22 2030  Jeanelle MallingLe, Oprah Camarena, PA 11/19/22 2038    Mardene SayerBranham, Victoria C, MD 11/19/22 [phone]

## 2022-11-20 ENCOUNTER — Emergency Department (HOSPITAL_COMMUNITY)
Admission: EM | Admit: 2022-11-20 | Discharge: 2022-11-20 | Disposition: A | Discharge status: Home / Self Care | Payer: Medicaid Other | Attending: Emergency Medicine | Admitting: Emergency Medicine

## 2022-11-20 ENCOUNTER — Other Ambulatory Visit: Payer: Self-pay

## 2022-11-20 DIAGNOSIS — Z91040 Latex allergy status: Secondary | ICD-10-CM | POA: Diagnosis not present

## 2022-11-20 DIAGNOSIS — R079 Chest pain, unspecified: Secondary | ICD-10-CM

## 2022-11-20 DIAGNOSIS — R0789 Other chest pain: Secondary | ICD-10-CM | POA: Insufficient documentation

## 2022-11-20 LAB — TROPONIN I (HIGH SENSITIVITY): Troponin I (High Sensitivity): 2 ng/L (ref <18)

## 2022-11-20 MED ORDER — HYDROXYZINE HCL 25 MG PO TABS
25.0000 mg | ORAL_TABLET | Freq: Once | ORAL | Status: AC
  Administered 2022-11-20: 25 mg via ORAL
  Filled 2022-11-20: qty 1

## 2022-11-20 MED ORDER — IBUPROFEN 800 MG PO TABS
800.0000 mg | ORAL_TABLET | Freq: Once | ORAL | Status: AC
  Administered 2022-11-20: 800 mg via ORAL
  Filled 2022-11-20: qty 1

## 2022-11-20 NOTE — Discharge Instructions (Signed)
Your workup in the ED was reassuring.  We recommend naproxen as previously prescribed to you.  You may also try over-the-counter Lidoderm patches applied to area of pain.  Follow-up with a primary care doctor for further evaluation of your symptoms.

## 2022-11-20 NOTE — ED Triage Notes (Signed)
Pt sobbing and hyperventilating in triage.  Seen for same yesterday but left prior to work-up being completed.  Pain starts in her back and radiates to chest with deep breath.

## 2022-11-20 NOTE — ED Provider Notes (Signed)
[place] EMERGENCY DEPARTMENT AT [facility] Provider Note   CSN: [id number] Arrival date & time: 11/20/22  0007     History  Chief Complaint  Patient presents with   Chest Pain    [name] is a 37 y.o. female.  37 year old female presents to the emergency department for evaluation of sharp, aching pain in her left chest.  She was seen 7 hours ago in the ED for these complaints.  States that they continued which brought her back to the ED.  Pain is aggravated with deep breathing as well as certain movements and palpation to her chest wall.  Denies any known modifying factors of her pain.  Denies recent surgeries, hospitalizations, the use of birth control.  No associated fevers, hemoptysis, leg swelling.  The history is provided by the patient. No language interpreter was used.  Chest Pain      Home Medications Prior to Admission medications   Medication Sig Start Date End Date Taking? Authorizing Provider  acetaminophen (TYLENOL) 325 MG tablet Take 650 mg by mouth every 6 (six) hours as needed for mild pain.     [provider]  etonogestrel (NEXPLANON) 68 MG IMPL implant 1 each by Subdermal route once.    [provider]  Iron, Ferrous Sulfate, 325 (65 Fe) MG TABS Take 325 mg by mouth daily. 12/07/21   Grayce Sessions, NP  naproxen (NAPROSYN) 500 MG tablet Take 1 tablet (500 mg total) by mouth 2 (two) times daily. 11/19/22   Jeanelle Malling, PA      Allergies    Latex    Review of Systems   Review of Systems  Cardiovascular:  Positive for chest pain.  Ten systems reviewed and are negative for acute change, except as noted in the HPI.    Physical Exam Updated Vital Signs BP (!) 101/54   Pulse (!) 57   Temp 98.1 F (36.7 C) (Oral)   Resp 20   SpO2 100%   Physical Exam Vitals and nursing note reviewed.  Constitutional:      General: She is not in acute distress.    Appearance: She is well-developed. She is not diaphoretic.      Comments: Nontoxic appearing and in NAD  HENT:     Head: Normocephalic and atraumatic.  Eyes:     General: No scleral icterus.    Conjunctiva/sclera: Conjunctivae normal.  Cardiovascular:     Rate and Rhythm: Normal rate and regular rhythm.     Pulses: Normal pulses.  Pulmonary:     Effort: Pulmonary effort is normal. No respiratory distress.     Breath sounds: No stridor. No wheezing.     Comments: Respirations even and unlabored. TTP to left upper chest wall w/o crepitus as well a slightly under the L breast. Chest:     Chest wall: Tenderness present.  Musculoskeletal:        General: Normal range of motion.     Cervical back: Normal range of motion.  Skin:    General: Skin is warm and dry.     Coloration: Skin is not pale.     Findings: No erythema or rash.  Neurological:     Mental Status: She is alert and oriented to person, place, and time.     Coordination: Coordination normal.  Psychiatric:        Behavior: Behavior normal.     ED Results / Procedures / Treatments   Labs (all labs ordered are listed, but  only abnormal results are displayed) Labs Reviewed  TROPONIN I (HIGH SENSITIVITY)    EKG   Radiology DG Chest 2 View  Result Date: 11/19/2022 CLINICAL DATA:  Chest pain. EXAM: CHEST - 2 VIEW COMPARISON:  December 01, 2006 FINDINGS: The heart size and mediastinal contours are within normal limits. Both lungs are clear. The visualized skeletal structures are unremarkable. IMPRESSION: No active cardiopulmonary disease. Electronically Signed   By: Aram Candela M.D.   On: 11/19/2022 20:10    Procedures Procedures    Medications Ordered in ED Medications  hydrOXYzine (ATARAX) tablet 25 mg (25 mg Oral Given 11/20/22 0440)  ibuprofen (ADVIL) tablet 800 mg (800 mg Oral Given 11/20/22 0439)    ED Course/ Medical Decision Making/ A&P Clinical Course as of 11/20/22 0550  Tue Nov 20, 2022  0034 Repeat troponin 2; negative. [KH]    Clinical Course User Index [KH]  Antony Madura, PA-C                             Medical Decision Making Amount and/or Complexity of Data Reviewed ECG/medicine tests: ordered.  Risk Prescription drug management.   This patient presents to the ED for concern of chest pain, this involves an extensive number of treatment options, and is a complaint that carries with it a high risk of complications and morbidity.  The differential diagnosis includes ACS vs MSK vs PTX vs PNA vs PE   Co morbidities that complicate the patient evaluation  Anxiety    Additional history obtained:  External records from outside source obtained and reviewed including CXR completed during prior visit 8 hours ago and was negative   Lab Tests:  I Ordered, and personally interpreted labs.  The pertinent results include:  negative troponin    Cardiac Monitoring:  The patient was maintained on a cardiac monitor.  I personally viewed and interpreted the cardiac monitored which showed an underlying rhythm of: NSR   Medicines ordered and prescription drug management:  I ordered medication including Atarax and ibuprofen for chest pain  Reevaluation of the patient after these medicines showed that the patient improved I have reviewed the patients home medicines and have made adjustments as needed   Test Considered:  UDS   Problem List / ED Course:  Low suspicion for emergent cardiac etiology given reassuring workup today.  EKG is nonischemic and troponin negative.  Chest x-ray without evidence of mediastinal widening to suggest dissection.  No pneumothorax, pneumonia, pleural effusion.   Pulmonary embolus further considered; however, patient without tachycardia, tachypnea, dyspnea, hypoxia.  Patient is PERC negative. Reproducibility on palpation suggestive of musculoskeletal etiology.   Reevaluation:  After the interventions noted above, I reevaluated the patient and found that they have :improved   Dispostion:  After  consideration of the diagnostic results and the patients response to treatment, I feel that the patent would benefit from outpatient primary care follow up. Return precautions discussed and provided. Patient discharged in stable condition with no unaddressed concerns.          Final Clinical Impression(s) / ED Diagnoses Final diagnoses:  Nonspecific chest pain    Rx / DC Orders ED Discharge Orders     None         Antony Madura, PA-C 11/20/22 9179    Zadie Rhine, MD 11/20/22 [phone]

## 2022-11-30 ENCOUNTER — Ambulatory Visit (INDEPENDENT_AMBULATORY_CARE_PROVIDER_SITE_OTHER): Payer: Medicaid Other | Admitting: Primary Care

## 2022-12-20 ENCOUNTER — Ambulatory Visit (INDEPENDENT_AMBULATORY_CARE_PROVIDER_SITE_OTHER): Payer: Medicaid Other | Admitting: Primary Care

## 2023-01-14 ENCOUNTER — Emergency Department (HOSPITAL_COMMUNITY)
Admission: EM | Admit: 2023-01-14 | Discharge: 2023-01-14 | Disposition: A | Discharge status: Home / Self Care | Payer: Medicaid Other | Attending: Emergency Medicine | Admitting: Emergency Medicine

## 2023-01-14 ENCOUNTER — Telehealth (INDEPENDENT_AMBULATORY_CARE_PROVIDER_SITE_OTHER): Payer: Self-pay | Admitting: Primary Care

## 2023-01-14 ENCOUNTER — Other Ambulatory Visit: Payer: Self-pay

## 2023-01-14 DIAGNOSIS — J029 Acute pharyngitis, unspecified: Secondary | ICD-10-CM | POA: Insufficient documentation

## 2023-01-14 DIAGNOSIS — Z91040 Latex allergy status: Secondary | ICD-10-CM | POA: Insufficient documentation

## 2023-01-14 LAB — GROUP A STREP BY PCR: Group A Strep by PCR: NOT DETECTED

## 2023-01-14 MED ORDER — ACETAMINOPHEN 325 MG PO TABS
650.0000 mg | ORAL_TABLET | Freq: Once | ORAL | Status: AC | PRN
  Administered 2023-01-14: 650 mg via ORAL
  Filled 2023-01-14: qty 2

## 2023-01-14 NOTE — Discharge Instructions (Signed)
Return to the ED with any new or worsening signs or symptoms Please read attached guide concerning pharyngitis or a sore throat You may take Tylenol at home for pain in your throat. Please follow-up with your PCP for reevaluation

## 2023-01-14 NOTE — Telephone Encounter (Signed)
Unable to leave VM

## 2023-01-14 NOTE — ED Triage Notes (Signed)
Patient arrives EMS for a sore throat. Patient reports sore throat for 2 days. Denies fever.

## 2023-01-14 NOTE — ED Provider Notes (Signed)
[place] EMERGENCY DEPARTMENT AT [facility] Provider Note   CSN: [id number] Arrival date & time: 01/14/23  1914     History  Chief Complaint  Patient presents with   Sore Throat    Michelle [name] is a 37 y.o. female with medical history of anxiety, anemia, insomnia.  Patient presents to ED via EMS for evaluation of sore throat.  Patient states that for the last 2 or 3 days she has had progressively worsening sore throat.  She denies fevers at home, nausea or vomiting.  She denies medications prior to arrival.  She states her children at home are all sick with allergies however denies any strep throat with her children.  She denies any trouble swallowing, drooling, change in phonation.   Sore Throat       Home Medications Prior to Admission medications   Medication Sig Start Date End Date Taking? Authorizing Provider  acetaminophen (TYLENOL) 325 MG tablet Take 650 mg by mouth every 6 (six) hours as needed for mild pain.     [provider]  etonogestrel (NEXPLANON) 68 MG IMPL implant 1 each by Subdermal route once.    [provider]  Iron, Ferrous Sulfate, 325 (65 Fe) MG TABS Take 325 mg by mouth daily. 12/07/21   Grayce Sessions, NP  naproxen (NAPROSYN) 500 MG tablet Take 1 tablet (500 mg total) by mouth 2 (two) times daily. 11/19/22   Jeanelle Malling, PA      Allergies    Latex    Review of Systems   Review of Systems  HENT:  Positive for sore throat.   All other systems reviewed and are negative.   Physical Exam Updated Vital Signs BP 124/78 (BP Location: Left Arm)   Pulse 97   Temp 98.2 F (36.8 C) (Oral)   Resp 18   Ht 5\' 4"  (1.626 m)   Wt 97.1 kg   SpO2 96%   BMI 36.73 kg/m  Physical Exam Vitals and nursing note reviewed.  Constitutional:      General: She is not in acute distress.    Appearance: Normal appearance. She is not ill-appearing, toxic-appearing or diaphoretic.  HENT:     Head: Normocephalic and atraumatic.      Nose: Nose normal.     Mouth/Throat:     Mouth: Mucous membranes are moist.     Pharynx: Oropharynx is clear. No oropharyngeal exudate or posterior oropharyngeal erythema.  Eyes:     Extraocular Movements: Extraocular movements intact.     Conjunctiva/sclera: Conjunctivae normal.     Pupils: Pupils are equal, round, and reactive to light.  Cardiovascular:     Rate and Rhythm: Normal rate and regular rhythm.  Pulmonary:     Effort: Pulmonary effort is normal.     Breath sounds: Normal breath sounds. No wheezing.  Abdominal:     General: Abdomen is flat. Bowel sounds are normal.     Palpations: Abdomen is soft.     Tenderness: There is no abdominal tenderness.  Musculoskeletal:     Cervical back: Normal range of motion and neck supple. No tenderness.  Skin:    General: Skin is warm and dry.     Capillary Refill: Capillary refill takes less than 2 seconds.  Neurological:     Mental Status: She is alert and oriented to person, place, and time.     ED Results / Procedures / Treatments   Labs (all labs ordered are listed, but only abnormal results are  displayed) Labs Reviewed  GROUP A STREP BY PCR    EKG None  Radiology No results found.  Procedures Procedures   Medications Ordered in ED Medications  acetaminophen (TYLENOL) tablet 650 mg (650 mg Oral Given 01/14/23 1924)    ED Course/ Medical Decision Making/ A&P   Medical Decision Making Risk OTC drugs.   37 year old female presents to ED for evaluation.  Please see HPI for further details.  On examination patient is afebrile and nontachycardic.  Her lung sounds are clear bilaterally and she is not hypoxic on room air.  Abdomen soft and compressible throughout.  Posterior oropharynx is nonerythematous, there is no exudate.  Her uvula is midline.  She is handling secretions appropriately.  There is no drooling or change in phonation.  Patient group A strep testing here is negative.  Patient most likely has viral  pharyngitis.  Patient be advised to treat symptoms at home conservatively.  She was advised to follow-up with PCP.  She was encouraged to return to the ED with any new or worsening signs or symptoms.  She voiced understanding.  She all her questions answered to her satisfaction.  She is stable to discharge at this time.   Final Clinical Impression(s) / ED Diagnoses Final diagnoses:  Viral pharyngitis    Rx / DC Orders ED Discharge Orders     None         Clent Ridges 01/14/23 2025    Lorre Nick, MD 01/15/23 1622

## 2023-01-24 ENCOUNTER — Ambulatory Visit (INDEPENDENT_AMBULATORY_CARE_PROVIDER_SITE_OTHER): Payer: Medicaid Other | Admitting: Primary Care

## 2023-02-16 ENCOUNTER — Other Ambulatory Visit: Payer: Self-pay

## 2023-02-16 ENCOUNTER — Emergency Department (HOSPITAL_COMMUNITY)
Admission: EM | Admit: 2023-02-16 | Discharge: 2023-02-16 | Disposition: A | Discharge status: Home / Self Care | Payer: Medicaid Other | Attending: Emergency Medicine | Admitting: Emergency Medicine

## 2023-02-16 ENCOUNTER — Emergency Department (HOSPITAL_COMMUNITY): Payer: Medicaid Other

## 2023-02-16 DIAGNOSIS — R1012 Left upper quadrant pain: Secondary | ICD-10-CM | POA: Insufficient documentation

## 2023-02-16 DIAGNOSIS — R1011 Right upper quadrant pain: Secondary | ICD-10-CM | POA: Insufficient documentation

## 2023-02-16 DIAGNOSIS — Z91040 Latex allergy status: Secondary | ICD-10-CM | POA: Insufficient documentation

## 2023-02-16 DIAGNOSIS — R079 Chest pain, unspecified: Secondary | ICD-10-CM | POA: Insufficient documentation

## 2023-02-16 DIAGNOSIS — D509 Iron deficiency anemia, unspecified: Secondary | ICD-10-CM

## 2023-02-16 LAB — RETICULOCYTES
Immature Retic Fract: 20.9 % — ABNORMAL HIGH (ref 2.3–15.9)
RBC.: 4.48 MIL/uL (ref 3.87–5.11)
Retic Count, Absolute: 41.7 10*3/uL (ref 19.0–186.0)
Retic Ct Pct: 0.9 % (ref 0.4–3.1)

## 2023-02-16 LAB — BASIC METABOLIC PANEL
Anion gap: 12 (ref 5–15)
BUN: 5 mg/dL — ABNORMAL LOW (ref 6–20)
CO2: 25 mmol/L (ref 22–32)
Calcium: 9.5 mg/dL (ref 8.9–10.3)
Chloride: 103 mmol/L (ref 98–111)
Creatinine, Ser: 0.74 mg/dL (ref 0.44–1.00)
GFR, Estimated: >60 mL/min (ref >60)
Glucose, Bld: 101 mg/dL — ABNORMAL HIGH (ref 70–99)
Potassium: 3.9 mmol/L (ref 3.5–5.1)
Sodium: 140 mmol/L (ref 135–145)

## 2023-02-16 LAB — HEPATIC FUNCTION PANEL
ALT: 17 U/L (ref 0–44)
AST: 21 U/L (ref 15–41)
Albumin: 3.7 g/dL (ref 3.5–5.0)
Alkaline Phosphatase: 77 U/L (ref 38–126)
Bilirubin, Direct: <0.1 mg/dL (ref 0.0–0.2)
Total Bilirubin: 0.5 mg/dL (ref 0.3–1.2)
Total Protein: 7.6 g/dL (ref 6.5–8.1)

## 2023-02-16 LAB — TROPONIN I (HIGH SENSITIVITY)
Troponin I (High Sensitivity): 3 ng/L (ref <18)
Troponin I (High Sensitivity): 4 ng/L (ref <18)

## 2023-02-16 LAB — IRON AND TIBC
Iron: 23 ug/dL — ABNORMAL LOW (ref 28–170)
Saturation Ratios: 4 % — ABNORMAL LOW (ref 10.4–31.8)
TIBC: 538 ug/dL — ABNORMAL HIGH (ref 250–450)
UIBC: 515 ug/dL

## 2023-02-16 LAB — CBC
HCT: 31 % — ABNORMAL LOW (ref 36.0–46.0)
Hemoglobin: 8.4 g/dL — ABNORMAL LOW (ref 12.0–15.0)
MCH: 18.4 pg — ABNORMAL LOW (ref 26.0–34.0)
MCHC: 27.1 g/dL — ABNORMAL LOW (ref 30.0–36.0)
MCV: 67.8 fL — ABNORMAL LOW (ref 80.0–100.0)
Platelets: 503 10*3/uL — ABNORMAL HIGH (ref 150–400)
RBC: 4.57 MIL/uL (ref 3.87–5.11)
RDW: 20.7 % — ABNORMAL HIGH (ref 11.5–15.5)
WBC: 7.9 10*3/uL (ref 4.0–10.5)
nRBC: 0 % (ref 0.0–0.2)

## 2023-02-16 LAB — D-DIMER, QUANTITATIVE: D-Dimer, Quant: <0.27 ug/mL-FEU (ref 0.00–0.50)

## 2023-02-16 LAB — VITAMIN B12: Vitamin B-12: 302 pg/mL (ref 180–914)

## 2023-02-16 LAB — FERRITIN: Ferritin: 2 ng/mL — ABNORMAL LOW (ref 11–307)

## 2023-02-16 LAB — HCG, SERUM, QUALITATIVE: Preg, Serum: NEGATIVE

## 2023-02-16 LAB — FOLATE: Folate: 12.6 ng/mL (ref >5.9)

## 2023-02-16 LAB — LIPASE, BLOOD: Lipase: 37 U/L (ref 11–51)

## 2023-02-16 MED ORDER — ALUM & MAG HYDROXIDE-SIMETH 200-200-20 MG/5ML PO SUSP
30.0000 mL | Freq: Once | ORAL | Status: AC
  Administered 2023-02-16: 30 mL via ORAL
  Filled 2023-02-16: qty 30

## 2023-02-16 MED ORDER — KETOROLAC TROMETHAMINE 15 MG/ML IJ SOLN
15.0000 mg | Freq: Once | INTRAMUSCULAR | Status: AC
  Administered 2023-02-16: 15 mg via INTRAVENOUS
  Filled 2023-02-16: qty 1

## 2023-02-16 MED ORDER — IRON (FERROUS SULFATE) 325 (65 FE) MG PO TABS: 325.0000 mg | ORAL_TABLET | Freq: Every day | ORAL | 1 refills | Status: DC

## 2023-02-16 NOTE — ED Provider Notes (Signed)
[place] EMERGENCY DEPARTMENT AT [facility] Provider Note   CSN: [id number] Arrival date & time: 02/16/23  1237     History  Chief Complaint  Patient presents with   Chest Pain   Back Pain    Michelle [name] is a 37 y.o. female.  HPI 37 year old female presents with a chief complaint of chest pain.  States that the chest pains been on and off for couple months.  It is in her left side.  Feels like a dull ache.  Recurred over the last couple days after she did some lifting.  Since yesterday she has also had some pain under her bilateral lower ribs.  When she first got up this morning she coughed and had a little bit of blood in it but that was the only time.  She does not feel short of breath though does get a little worse in her chest/abdomen when she takes a deep breath.  No vomiting, vaginal bleeding, blood in the stools.  Pain right now is fairly mild. Her throat was also sore this morning, she's not sure if that was from acid reflux from eating spicy chips last night. She also wonders if these red chips could have caused the blood appearance this morning.  Home Medications Prior to Admission medications   Medication Sig Start Date End Date Taking? Authorizing Provider  acetaminophen (TYLENOL) 325 MG tablet Take 650 mg by mouth every 6 (six) hours as needed for mild pain.     [provider]  etonogestrel (NEXPLANON) 68 MG IMPL implant 1 each by Subdermal route once.    [provider]  Iron, Ferrous Sulfate, 325 (65 Fe) MG TABS Take 325 mg by mouth daily. 02/16/23   Pricilla Loveless, MD  naproxen (NAPROSYN) 500 MG tablet Take 1 tablet (500 mg total) by mouth 2 (two) times daily. 11/19/22   Jeanelle Malling, PA      Allergies    Latex    Review of Systems   Review of Systems  Constitutional:  Negative for fever.  Respiratory:  Negative for shortness of breath.   Cardiovascular:  Positive for chest pain.  Gastrointestinal:  Positive for abdominal pain.   Genitourinary:  Negative for vaginal bleeding.  Musculoskeletal:  Positive for back pain (chronic, unchanged).  Neurological:  Negative for numbness.    Physical Exam Updated Vital Signs BP 96/61   Pulse 67   Temp 97.7 F (36.5 C) (Oral)   Resp 17   Ht 5\' 4"  (1.626 m)   Wt 97.1 kg   SpO2 100%   BMI 36.73 kg/m  Physical Exam Vitals and nursing note reviewed.  Constitutional:      General: She is not in acute distress.    Appearance: She is well-developed. She is not ill-appearing or diaphoretic.  HENT:     Head: Normocephalic and atraumatic.  Cardiovascular:     Rate and Rhythm: Normal rate and regular rhythm.     Heart sounds: Normal heart sounds.  Pulmonary:     Effort: Pulmonary effort is normal.     Breath sounds: Normal breath sounds.  Abdominal:     Palpations: Abdomen is soft.     Tenderness: There is abdominal tenderness (mild) in the right upper quadrant and left upper quadrant.  Skin:    General: Skin is warm and dry.  Neurological:     Mental Status: She is alert.     ED Results / Procedures / Treatments   Labs (all labs  ordered are listed, but only abnormal results are displayed) Labs Reviewed  BASIC METABOLIC PANEL - Abnormal; Notable for the following components:      Result Value   Glucose, Bld 101 (*)    BUN 5 (*)    All other components within normal limits  CBC - Abnormal; Notable for the following components:   Hemoglobin 8.4 (*)    HCT 31.0 (*)    MCV 67.8 (*)    MCH 18.4 (*)    MCHC 27.1 (*)    RDW 20.7 (*)    Platelets 503 (*)    All other components within normal limits  IRON AND TIBC - Abnormal; Notable for the following components:   Iron 23 (*)    TIBC 538 (*)    Saturation Ratios 4 (*)    All other components within normal limits  FERRITIN - Abnormal; Notable for the following components:   Ferritin 2 (*)    All other components within normal limits  RETICULOCYTES - Abnormal; Notable for the following components:    Immature Retic Fract 20.9 (*)    All other components within normal limits  VITAMIN B12  FOLATE  D-DIMER, QUANTITATIVE  HEPATIC FUNCTION PANEL  HCG, SERUM, QUALITATIVE  LIPASE, BLOOD  TROPONIN I (HIGH SENSITIVITY)  TROPONIN I (HIGH SENSITIVITY)    EKG EKG Interpretation Date/Time:  Saturday February 16 2023 16:19:04 EDT Ventricular Rate:  58 PR Interval:  136 QRS Duration:  88 QT Interval:  432 QTC Calculation: 425 R Axis:   5  Text Interpretation: Sinus rhythm Borderline T abnormalities, anterior leads Confirmed by Pricilla Loveless [phone]) on 02/16/2023 4:38:36 PM  Radiology US Abdomen Limited RUQ (LIVER/GB)  Result Date: 02/16/2023 CLINICAL DATA:  Abdominal pain EXAM: ULTRASOUND ABDOMEN LIMITED RIGHT UPPER QUADRANT COMPARISON:  None Available. FINDINGS: Gallbladder: No gallstones or wall thickening visualized. No sonographic Murphy sign noted by sonographer. Common bile duct: Diameter: Normal caliber, 3 mm Liver: No focal lesion identified. Within normal limits in parenchymal echogenicity. Portal vein is patent on color Doppler imaging with normal direction of blood flow towards the liver. Other: None. IMPRESSION: Normal study. Electronically Signed   By: Charlett Nose M.D.   On: 02/16/2023 18:22   DG Chest 1 View  Result Date: 02/16/2023 CLINICAL DATA:  Chest pain EXAM: CHEST  1 VIEW COMPARISON:  11/19/2022 FINDINGS: The heart size and mediastinal contours are within normal limits. Both lungs are clear. The visualized skeletal structures are unremarkable. IMPRESSION: No active disease. Electronically Signed   By: Duanne Guess D.O.   On: 02/16/2023 15:18    Procedures Procedures    Medications Ordered in ED Medications  ketorolac (TORADOL) 15 MG/ML injection 15 mg (15 mg Intravenous Given 02/16/23 1724)  alum & mag hydroxide-simeth (MAALOX/MYLANTA) 200-200-20 MG/5ML suspension 30 mL (30 mLs Oral Given 02/16/23 1708)    ED Course/ Medical Decision Making/ A&P                              Medical Decision Making Amount and/or Complexity of Data Reviewed Labs: ordered.    Details: Chronic anemia.  Normal troponins.  Normal D-dimer. Radiology: ordered and independent interpretation performed.    Details: No CHF.  No cholecystitis. ECG/medicine tests: ordered and independent interpretation performed.    Details: No acute ischemia.  Nonspecific T waves.  Risk OTC drugs. Prescription drug management.   Patient with nonspecific chest pain.  Also some upper abdominal discomfort, most  likely reflux/gastritis.  Feels better with some Maalox and Toradol.  Troponins are negative and D-dimer is negative.  I have very low suspicion of ACS, PE, dissection.  The chest pains been on and off for a while.  She also has some chronic but unchanged back pain.  Given her upper abdominal discomfort right upper quadrant ultrasound was obtained but is negative.  Right now she is completely pain-free.  I have low suspicion for acute intra-abdominal emergency and do not think a CT is needed.  For now will discharge home with return precautions.  She has noted to be anemic and states that she has not been taking iron tablets as she has not been prescribed these in a while.  Discussed she should try go back on them and also discussed this with her PCP.  Seems like she is seeing the [facility] before.  Will refer back to them.        Final Clinical Impression(s) / ED Diagnoses Final diagnoses:  Nonspecific chest pain  Iron deficiency anemia, unspecified iron deficiency anemia type    Rx / DC Orders ED Discharge Orders          Ordered    Iron, Ferrous Sulfate, 325 (65 Fe) MG TABS  Daily        02/16/23 1903              Pricilla Loveless, MD 02/16/23 1910

## 2023-02-16 NOTE — ED Provider Triage Note (Signed)
Emergency Medicine Provider Triage Evaluation Note  Jacquelyne Balint , a 37 y.o. female  was evaluated in triage.  Pt complains of chest pain.  Localized to the substernal area.  Does not radiate.  Noticed it when she woke up this morning.  States has been intermittent for the past 2 months.  It is not exertional.  No associated nausea, vomiting or diaphoresis.  Review of Systems  Positive: As above Negative: As above  Physical Exam  BP (!) 114/49 (BP Location: Right Arm)   Pulse 60   Temp 98.6 F (37 C) (Oral)   Resp 18   Ht 5\' 4"  (1.626 m)   Wt 97.1 kg   SpO2 100%   BMI 36.73 kg/m  Gen:   Awake, no distress   Resp:  Normal effort  MSK:   Moves extremities without difficulty  Other:    Medical Decision Making  Medically screening exam initiated at 2:35 PM.  Appropriate orders placed.  YARED HERPIN was informed that the remainder of the evaluation will be completed by another provider, this initial triage assessment does not replace that evaluation, and the importance of remaining in the ED until their evaluation is complete.  Workup initiated   Michelle Piper, Cordelia Poche 02/16/23 1436

## 2023-02-16 NOTE — ED Notes (Signed)
Patient transported to Ultrasound 

## 2023-02-16 NOTE — Discharge Instructions (Addendum)
If you develop recurrent, continued, or worsening chest pain, shortness of breath, fever, vomiting, abdominal or back pain, or any other new/concerning symptoms then return to the ER for evaluation.  

## 2023-02-16 NOTE — ED Triage Notes (Signed)
Pt. Stated, Iv had chest pain, back pain, both sides of my stomach in pain and my throat hurts. This started about 2-3 days ago. I was here a couple of months ago with chest pain , but they said it was not related to my heart.

## 2023-03-17 ENCOUNTER — Encounter (HOSPITAL_BASED_OUTPATIENT_CLINIC_OR_DEPARTMENT_OTHER): Payer: Self-pay

## 2023-03-17 ENCOUNTER — Emergency Department (HOSPITAL_BASED_OUTPATIENT_CLINIC_OR_DEPARTMENT_OTHER): Payer: Medicaid Other | Admitting: Radiology

## 2023-03-17 ENCOUNTER — Emergency Department (HOSPITAL_BASED_OUTPATIENT_CLINIC_OR_DEPARTMENT_OTHER)
Admission: EM | Admit: 2023-03-17 | Discharge: 2023-03-17 | Disposition: A | Discharge status: Home / Self Care | Payer: Medicaid Other | Attending: Emergency Medicine | Admitting: Emergency Medicine

## 2023-03-17 DIAGNOSIS — Z91040 Latex allergy status: Secondary | ICD-10-CM | POA: Diagnosis not present

## 2023-03-17 DIAGNOSIS — R09A2 Foreign body sensation, throat: Secondary | ICD-10-CM | POA: Insufficient documentation

## 2023-03-17 NOTE — ED Triage Notes (Signed)
She states that she "used to have a tongue ring and then it was missing--it feels like it's maybe in my throat" (points to mid left carotid area).  She is breathing normally; is able to eat and is in no distress.

## 2023-03-17 NOTE — Discharge Instructions (Addendum)
It was a pleasure caring for you today.  X-ray without concern for foreign body.  Seek emergency care if experiencing any new or worsening symptoms.

## 2023-03-17 NOTE — ED Provider Notes (Signed)
[place] EMERGENCY DEPARTMENT AT [facility] Provider Note   CSN: [id number] Arrival date & time: 03/17/23  1456     History  Chief Complaint  Patient presents with   F.B. sensation left throat    Michelle [name] is a 37 y.o. female who presents to ED concerned for foreign body in throat. Patient states that she had a tongue ring and it went missing. States that it feels like something is stuck in her throat right behind thyroid cartilage area. Patient is able to eat and drink without problems. Denies choking or dyspnea/SOB.  Denies fever, chest pain, cough, nausea, vomiting, abdominal pain.  HPI     Home Medications Prior to Admission medications   Medication Sig Start Date End Date Taking? Authorizing Provider  acetaminophen (TYLENOL) 325 MG tablet Take 650 mg by mouth every 6 (six) hours as needed for mild pain.     [provider]  etonogestrel (NEXPLANON) 68 MG IMPL implant 1 each by Subdermal route once.    [provider]  Iron, Ferrous Sulfate, 325 (65 Fe) MG TABS Take 325 mg by mouth daily. 02/16/23   Pricilla Loveless, MD  naproxen (NAPROSYN) 500 MG tablet Take 1 tablet (500 mg total) by mouth 2 (two) times daily. 11/19/22   Jeanelle Malling, PA      Allergies    Latex    Review of Systems   Review of Systems  HENT:         Foreign body sensation in throat    Physical Exam Updated Vital Signs BP 109/62 (BP Location: Right Arm)   Pulse (!) 59   Temp 98 F (36.7 C) (Oral)   Resp 16   LMP  (LMP Unknown)   SpO2 99%  Physical Exam Vitals and nursing note reviewed.  Constitutional:      General: She is not in acute distress.    Appearance: She is not ill-appearing or toxic-appearing.  HENT:     Head: Normocephalic and atraumatic.     Mouth/Throat:     Mouth: Mucous membranes are moist.     Pharynx: Oropharynx is clear.  Eyes:     General: No scleral icterus.       Right eye: No discharge.        Left eye: No discharge.      Conjunctiva/sclera: Conjunctivae normal.  Cardiovascular:     Rate and Rhythm: Normal rate and regular rhythm.     Pulses: Normal pulses.     Heart sounds: Normal heart sounds. No murmur heard. Pulmonary:     Effort: Pulmonary effort is normal. No respiratory distress.     Breath sounds: Normal breath sounds. No wheezing, rhonchi or rales.  Abdominal:     General: Abdomen is flat. Bowel sounds are normal.     Palpations: Abdomen is soft. There is no mass.     Tenderness: There is no abdominal tenderness.  Musculoskeletal:     Right lower leg: No edema.     Left lower leg: No edema.  Skin:    General: Skin is warm and dry.     Findings: No rash.  Neurological:     General: No focal deficit present.     Mental Status: She is alert. Mental status is at baseline.  Psychiatric:        Mood and Affect: Mood normal.        Behavior: Behavior normal.     ED Results / Procedures / Treatments   Labs (  all labs ordered are listed, but only abnormal results are displayed) Labs Reviewed - No data to display  EKG None  Radiology DG Neck Soft Tissue  Result Date: 03/17/2023 CLINICAL DATA:  Evaluate for possible foreign body. EXAM: NECK SOFT TISSUES - 1+ VIEW COMPARISON:  None Available. FINDINGS: There is no evidence of retropharyngeal soft tissue swelling or epiglottic enlargement. The cervical airway is unremarkable and no radio-opaque foreign body identified. IMPRESSION: Negative. Electronically Signed   By: Signa Kell M.D.   On: 03/17/2023 16:04    Procedures Procedures    Medications Ordered in ED Medications - No data to display  ED Course/ Medical Decision Making/ A&P                                 Medical Decision Making Amount and/or Complexity of Data Reviewed Radiology: ordered.   This patient presents to the ED for concern of possible foreign body in throat, this involves an extensive number of treatment options, and is a complaint that carries with it a high  risk of complications and morbidity.  The differential diagnosis includes foreign body, deep space infection, anaphylaxis   Co morbidities that complicate the patient evaluation  none   Imaging Studies ordered:  I ordered imaging studies including  -xray soft tissue neck: To assess for foreign bodies I independently visualized and interpreted imaging  I agree with the radiologist interpretation    Problem List / ED Course / Critical interventions / Medication management  Patient presents to emergency room because she states that she lost her tongue ring and feels like there is something stuck in her throat behind her thyroid cartilage area.  Patient able to tolerate food and liquids without difficulties.  Denies choking or trouble breathing.  Physical exam unremarkable.  Patient afebrile with stable vitals.  X-ray without concern for foreign bodies or swelling of esophagus. I have reviewed the patients home medicines and have made adjustments as needed Provided patient with return precautions.  Discharged in good condition.   Social Determinants of Health:  none           Final Clinical Impression(s) / ED Diagnoses Final diagnoses:  Foreign body sensation, throat    Rx / DC Orders ED Discharge Orders     None         Margarita Rana 03/17/23 1703    Benjiman Core, MD 03/17/23 [phone]

## 2023-03-26 ENCOUNTER — Emergency Department (HOSPITAL_COMMUNITY)
Admission: EM | Admit: 2023-03-26 | Discharge: 2023-03-26 | Disposition: A | Discharge status: Home / Self Care | Payer: Medicaid Other | Attending: Emergency Medicine | Admitting: Emergency Medicine

## 2023-03-26 ENCOUNTER — Emergency Department (HOSPITAL_COMMUNITY): Payer: Medicaid Other

## 2023-03-26 ENCOUNTER — Other Ambulatory Visit: Payer: Self-pay

## 2023-03-26 DIAGNOSIS — S299XXA Unspecified injury of thorax, initial encounter: Secondary | ICD-10-CM | POA: Diagnosis present

## 2023-03-26 DIAGNOSIS — X58XXXA Exposure to other specified factors, initial encounter: Secondary | ICD-10-CM | POA: Insufficient documentation

## 2023-03-26 DIAGNOSIS — Z91040 Latex allergy status: Secondary | ICD-10-CM | POA: Insufficient documentation

## 2023-03-26 DIAGNOSIS — S29011A Strain of muscle and tendon of front wall of thorax, initial encounter: Secondary | ICD-10-CM | POA: Diagnosis not present

## 2023-03-26 DIAGNOSIS — T148XXA Other injury of unspecified body region, initial encounter: Secondary | ICD-10-CM

## 2023-03-26 DIAGNOSIS — R079 Chest pain, unspecified: Secondary | ICD-10-CM

## 2023-03-26 LAB — COMPREHENSIVE METABOLIC PANEL
ALT: 21 U/L (ref 0–44)
AST: 23 U/L (ref 15–41)
Albumin: 4.1 g/dL (ref 3.5–5.0)
Alkaline Phosphatase: 78 U/L (ref 38–126)
Anion gap: 5 (ref 5–15)
BUN: 7 mg/dL (ref 6–20)
CO2: 26 mmol/L (ref 22–32)
Calcium: 8.9 mg/dL (ref 8.9–10.3)
Chloride: 104 mmol/L (ref 98–111)
Creatinine, Ser: 0.75 mg/dL (ref 0.44–1.00)
GFR, Estimated: >60 mL/min (ref >60)
Glucose, Bld: 93 mg/dL (ref 70–99)
Potassium: 3.8 mmol/L (ref 3.5–5.1)
Sodium: 135 mmol/L (ref 135–145)
Total Bilirubin: 0.3 mg/dL (ref 0.3–1.2)
Total Protein: 8 g/dL (ref 6.5–8.1)

## 2023-03-26 LAB — CBC WITH DIFFERENTIAL/PLATELET
Abs Immature Granulocytes: 0.02 10*3/uL (ref 0.00–0.07)
Basophils Absolute: 0 10*3/uL (ref 0.0–0.1)
Basophils Relative: 0 %
Eosinophils Absolute: 0.2 10*3/uL (ref 0.0–0.5)
Eosinophils Relative: 3 %
HCT: 32.8 % — ABNORMAL LOW (ref 36.0–46.0)
Hemoglobin: 9 g/dL — ABNORMAL LOW (ref 12.0–15.0)
Immature Granulocytes: 0 %
Lymphocytes Relative: 35 %
Lymphs Abs: 2.6 10*3/uL (ref 0.7–4.0)
MCH: 19.1 pg — ABNORMAL LOW (ref 26.0–34.0)
MCHC: 27.4 g/dL — ABNORMAL LOW (ref 30.0–36.0)
MCV: 69.6 fL — ABNORMAL LOW (ref 80.0–100.0)
Monocytes Absolute: 0.6 10*3/uL (ref 0.1–1.0)
Monocytes Relative: 9 %
Neutro Abs: 3.9 10*3/uL (ref 1.7–7.7)
Neutrophils Relative %: 53 %
Platelets: 395 10*3/uL (ref 150–400)
RBC: 4.71 MIL/uL (ref 3.87–5.11)
RDW: 22.4 % — ABNORMAL HIGH (ref 11.5–15.5)
WBC: 7.5 10*3/uL (ref 4.0–10.5)
nRBC: 0 % (ref 0.0–0.2)

## 2023-03-26 LAB — TROPONIN I (HIGH SENSITIVITY): Troponin I (High Sensitivity): <2 ng/L (ref <18)

## 2023-03-26 LAB — HCG, SERUM, QUALITATIVE: Preg, Serum: NEGATIVE

## 2023-03-26 LAB — D-DIMER, QUANTITATIVE: D-Dimer, Quant: 0.39 ug/mL-FEU (ref 0.00–0.50)

## 2023-03-26 MED ORDER — CYCLOBENZAPRINE HCL 5 MG PO TABS: 5.0000 mg | ORAL_TABLET | Freq: Three times a day (TID) | ORAL | 0 refills | Status: DC | PRN

## 2023-03-26 MED ORDER — BENGAY ULTRA STRENGTH 4-10-30 % EX CREA: 1.0000 | TOPICAL_CREAM | Freq: Four times a day (QID) | CUTANEOUS | 0 refills | Status: DC | PRN

## 2023-03-26 MED ORDER — MUSCLE RUB 10-15 % EX CREA: TOPICAL_CREAM | CUTANEOUS | Status: DC | PRN

## 2023-03-26 MED ORDER — CYCLOBENZAPRINE HCL 10 MG PO TABS
10.0000 mg | ORAL_TABLET | Freq: Once | ORAL | Status: AC
  Administered 2023-03-26: 10 mg via ORAL
  Filled 2023-03-26: qty 1

## 2023-03-26 NOTE — ED Notes (Signed)
Recollect blue top tube sent to lab.

## 2023-03-26 NOTE — Discharge Instructions (Addendum)
Your blood work is reassuring, your heart enzymes were within normal limits, and you are low risk for blood clot.  Return to the ER if you feel like your symptoms are worsening.  I am suspicious that your pain is likely secondary to a musculoskeletal problem.  I recommend that you may get a massage outpatient, and use the muscle relaxer as needed.  Do not use the muscle relaxer, when you are driving as it may cause increased sleepiness.  Did also take ibuprofen, or your home naproxen for pain.  Do not take both though, take 1 or the other.

## 2023-03-26 NOTE — ED Provider Notes (Signed)
EMERGENCY DEPARTMENT AT [facility] Provider Note   CSN: [id number] Arrival date & time: 03/26/23  1415     History  Chief Complaint  Patient presents with   Chest Pain    CHRSTINA [name] is a 37 y.o. female, no pertinent past medical history, who presents to the ED secondary to left chest pain, radiating to the back, has been going on for the last few years.  She states that she has a lot of pain in her back, as well as her chest.  Feels like it is sharp and stabbing, denies any shortness of breath associated with this, but she states is very tender.  She has tried warm compresses, muscle relaxers, and lidocaine patches.  She states the only thing really has helped is muscle relaxers.  She is not on any current birth control, no history of DVTs or PEs.  Home Medications Prior to Admission medications   Medication Sig Start Date End Date Taking? Authorizing Provider  Camphor-Menthol-Methyl Sal (BEN GAY ULTRA STRENGTH) 11-20-28 % CREA Apply 1 Application topically 4 (four) times daily as needed. 03/26/23  Yes Herberta Pickron L, PA  cyclobenzaprine (FLEXERIL) 5 MG tablet Take 1 tablet (5 mg total) by mouth 3 (three) times daily as needed. 03/26/23  Yes Zayanna Pundt L, PA  acetaminophen (TYLENOL) 325 MG tablet Take 650 mg by mouth every 6 (six) hours as needed for mild pain.     [provider]  etonogestrel (NEXPLANON) 68 MG IMPL implant 1 each by Subdermal route once.    [provider]  Iron, Ferrous Sulfate, 325 (65 Fe) MG TABS Take 325 mg by mouth daily. 02/16/23   Pricilla Loveless, MD  naproxen (NAPROSYN) 500 MG tablet Take 1 tablet (500 mg total) by mouth 2 (two) times daily. 11/19/22   Jeanelle Malling, PA      Allergies    Latex    Review of Systems   Review of Systems  Respiratory:  Negative for shortness of breath.   Cardiovascular:  Positive for chest pain.    Physical Exam Updated Vital Signs BP 120/78 (BP Location: Right Arm)   Pulse 80    Temp 98.9 F (37.2 C) (Oral)   Resp 18   Ht 5\' 4"  (1.626 m)   Wt 97.1 kg   LMP  (LMP Unknown)   SpO2 97%   BMI 36.74 kg/m  Physical Exam Vitals and nursing note reviewed.  Constitutional:      General: She is not in acute distress.    Appearance: She is well-developed.  HENT:     Head: Normocephalic and atraumatic.  Eyes:     Conjunctiva/sclera: Conjunctivae normal.  Cardiovascular:     Rate and Rhythm: Normal rate and regular rhythm.     Heart sounds: No murmur heard. Pulmonary:     Effort: Pulmonary effort is normal. No respiratory distress.     Breath sounds: Normal breath sounds.  Chest:     Comments: Tenderness to palpation of the left lateral chest wall as well as shoulder.  No evidence of any kind of rash, mass, wound. Abdominal:     Palpations: Abdomen is soft.     Tenderness: There is no abdominal tenderness.  Musculoskeletal:        General: No swelling.     Cervical back: Neck supple.  Skin:    General: Skin is warm and dry.     Capillary Refill: Capillary refill takes less than 2 seconds.  Neurological:  Mental Status: She is alert.  Psychiatric:        Mood and Affect: Mood normal.     ED Results / Procedures / Treatments   Labs (all labs ordered are listed, but only abnormal results are displayed) Labs Reviewed  CBC WITH DIFFERENTIAL/PLATELET - Abnormal; Notable for the following components:      Result Value   Hemoglobin 9.0 (*)    HCT 32.8 (*)    MCV 69.6 (*)    MCH 19.1 (*)    MCHC 27.4 (*)    RDW 22.4 (*)    All other components within normal limits  COMPREHENSIVE METABOLIC PANEL  HCG, SERUM, QUALITATIVE  D-DIMER, QUANTITATIVE (NOT AT [facility])  TROPONIN I (HIGH SENSITIVITY)  TROPONIN I (HIGH SENSITIVITY)    EKG None  Radiology DG Chest Portable 1 View  Result Date: 03/26/2023 CLINICAL DATA:  Chest pain. EXAM: PORTABLE CHEST 1 VIEW COMPARISON:  02/16/2023 FINDINGS: Lower lung volumes from prior exam. The cardiomediastinal contours  are normal. Pulmonary vasculature is normal. No consolidation, pleural effusion, or pneumothorax. No acute osseous abnormalities are seen. IMPRESSION: Low lung volumes without acute chest finding. Electronically Signed   By: Narda Rutherford M.D.   On: 03/26/2023 17:46    Procedures Procedures    Medications Ordered in ED Medications  Muscle Rub CREA (has no administration in time range)  cyclobenzaprine (FLEXERIL) tablet 10 mg (10 mg Oral Given 03/26/23 1729)    ED Course/ Medical Decision Making/ A&P                                 Medical Decision Making Patient is a 37 year old female, here for chest pain, has been going on for the last few years.  Is worse with movement, and she is tender on exam.  Will obtain a D-dimer given her young status, and possible use of oral contraceptives, as well as tenderness to palpation of chest wall, she is however low risk.  Additionally we will obtain a chest x-ray, troponin, EKG.  I am suspicious that this likely is musculoskeletal, we will try some muscle relaxers and Bengay cream for this.  Amount and/or Complexity of Data Reviewed Labs: ordered.    Details: D-dimer less than 0.5, troponin normal Radiology: ordered.    Details: Chest x-ray clear ECG/medicine tests:     Details: Normal sinus rhythm Discussion of management or test interpretation with external provider(s): Discussed with patient, her D-dimer is reassuring, troponin is within normal limits, she is feeling better after the muscle relaxer.  I think this is mostly due to musculoskeletal pain, I advised her to follow-up with her primary care doctor, and possibly get a massage outpatient given this pain.  Return precautions were emphasized.  Heart score of a 0.  Risk OTC drugs. Prescription drug management.    Final Clinical Impression(s) / ED Diagnoses Final diagnoses:  Chest pain, unspecified type  Muscle strain    Rx / DC Orders ED Discharge Orders          Ordered     cyclobenzaprine (FLEXERIL) 5 MG tablet  3 times daily PRN        03/26/23 1912    Camphor-Menthol-Methyl Sal (BEN GAY ULTRA STRENGTH) 11-20-28 % CREA  4 times daily PRN        03/26/23 1912              Randal Yepiz, Harley [place], PA 03/26/23 1924  Arby Barrette, MD 03/31/23 [phone]

## 2023-03-26 NOTE — ED Notes (Signed)
PA declined need for second troponin.

## 2023-03-26 NOTE — ED Triage Notes (Signed)
Pt from home via [facility] for reports of lt sided chest pain going into her lt shoulder and arm. EMS reports pt very anxious with notable muscle tension in similar area.

## 2023-04-24 ENCOUNTER — Emergency Department (HOSPITAL_COMMUNITY)
Admission: EM | Admit: 2023-04-24 | Discharge: 2023-04-24 | Disposition: A | Discharge status: Home / Self Care | Payer: Medicaid Other | Attending: Emergency Medicine | Admitting: Emergency Medicine

## 2023-04-24 ENCOUNTER — Encounter (HOSPITAL_COMMUNITY): Payer: Self-pay

## 2023-04-24 ENCOUNTER — Other Ambulatory Visit: Payer: Self-pay

## 2023-04-24 DIAGNOSIS — R109 Unspecified abdominal pain: Secondary | ICD-10-CM | POA: Diagnosis present

## 2023-04-24 DIAGNOSIS — Z91040 Latex allergy status: Secondary | ICD-10-CM | POA: Diagnosis not present

## 2023-04-24 LAB — HCG, SERUM, QUALITATIVE: Preg, Serum: NEGATIVE

## 2023-04-24 LAB — COMPREHENSIVE METABOLIC PANEL
ALT: 30 U/L (ref 0–44)
AST: 30 U/L (ref 15–41)
Albumin: 3.9 g/dL (ref 3.5–5.0)
Alkaline Phosphatase: 66 U/L (ref 38–126)
Anion gap: 10 (ref 5–15)
BUN: 5 mg/dL — ABNORMAL LOW (ref 6–20)
CO2: 22 mmol/L (ref 22–32)
Calcium: 8.9 mg/dL (ref 8.9–10.3)
Chloride: 104 mmol/L (ref 98–111)
Creatinine, Ser: 0.77 mg/dL (ref 0.44–1.00)
GFR, Estimated: >60 mL/min (ref >60)
Glucose, Bld: 96 mg/dL (ref 70–99)
Potassium: 3.3 mmol/L — ABNORMAL LOW (ref 3.5–5.1)
Sodium: 136 mmol/L (ref 135–145)
Total Bilirubin: 0.2 mg/dL — ABNORMAL LOW (ref 0.3–1.2)
Total Protein: 7.5 g/dL (ref 6.5–8.1)

## 2023-04-24 LAB — CBC
HCT: 33.3 % — ABNORMAL LOW (ref 36.0–46.0)
Hemoglobin: 9.3 g/dL — ABNORMAL LOW (ref 12.0–15.0)
MCH: 19.8 pg — ABNORMAL LOW (ref 26.0–34.0)
MCHC: 27.9 g/dL — ABNORMAL LOW (ref 30.0–36.0)
MCV: 71 fL — ABNORMAL LOW (ref 80.0–100.0)
Platelets: 440 10*3/uL — ABNORMAL HIGH (ref 150–400)
RBC: 4.69 MIL/uL (ref 3.87–5.11)
RDW: 23.9 % — ABNORMAL HIGH (ref 11.5–15.5)
WBC: 8.9 10*3/uL (ref 4.0–10.5)
nRBC: 0 % (ref 0.0–0.2)

## 2023-04-24 LAB — LIPASE, BLOOD: Lipase: 40 U/L (ref 11–51)

## 2023-04-24 NOTE — Discharge Instructions (Signed)
Can take tylenol or motrin as needed to help with cramping if it recurs. Make sure to drink lots of fluids and eat vegetables, this will help keep stools loose. Follow-up with your primary care doctor. Return here for new concerns.

## 2023-04-24 NOTE — ED Triage Notes (Signed)
Pt states that she had constipation x 1 week and took Miralax. Pt now complains of diarrhea and generalized abdominal cramping, as well as decreases appetite. Denies vomiting.

## 2023-04-24 NOTE — ED Provider Notes (Signed)
Michelle [name]   CSN: [id number] Arrival date & time: 04/24/23  0865     History  Chief Complaint  Patient presents with   Abdominal Pain    Michelle [name] is a 37 y.o. female.  The history is provided by the patient and medical records.  Abdominal Pain  37 year old female presenting to the ED with abdominal pain.  Patient reports she has been having some constipation recently and took MiraLAX which produced large amount of loose stools.  She is also on her menstrual cycle.  States today she has had a lot of abdominal cramping and did not understand where it was coming from.  She denies any nausea, vomiting, or fever.  She has not had any blood in the stools.  States she has taken tablet style laxatives in the past but is never taken MiraLAX.  Home Medications Prior to Admission medications   Medication Sig Start Date End Date Taking? Authorizing Provider  acetaminophen (TYLENOL) 325 MG tablet Take 650 mg by mouth every 6 (six) hours as needed for mild pain.     [provider]  Camphor-Menthol-Methyl Sal (BEN GAY ULTRA STRENGTH) 11-20-28 % CREA Apply 1 Application topically 4 (four) times daily as needed. 03/26/23   Small, Brooke L, PA  cyclobenzaprine (FLEXERIL) 5 MG tablet Take 1 tablet (5 mg total) by mouth 3 (three) times daily as needed. 03/26/23   Small, Brooke L, PA  etonogestrel (NEXPLANON) 68 MG IMPL implant 1 each by Subdermal route once.    [provider]  Iron, Ferrous Sulfate, 325 (65 Fe) MG TABS Take 325 mg by mouth daily. 02/16/23   Pricilla Loveless, MD  naproxen (NAPROSYN) 500 MG tablet Take 1 tablet (500 mg total) by mouth 2 (two) times daily. 11/19/22   Jeanelle Malling, PA      Allergies    Latex    Review of Systems   Review of Systems  Gastrointestinal:  Positive for abdominal pain.  All other systems reviewed and are negative.   Physical Exam Updated Vital Signs BP 111/62   Pulse 66    Temp 98.2 F (36.8 C) (Oral)   Resp 15   LMP 04/23/2023 (Exact Date)   SpO2 100%  Physical Exam Vitals and nursing [name] reviewed.  Constitutional:      Appearance: She is well-developed.  HENT:     Head: Normocephalic and atraumatic.  Eyes:     Conjunctiva/sclera: Conjunctivae normal.     Pupils: Pupils are equal, round, and reactive to light.  Cardiovascular:     Rate and Rhythm: Normal rate and regular rhythm.     Heart sounds: Normal heart sounds.  Pulmonary:     Effort: Pulmonary effort is normal.     Breath sounds: Normal breath sounds.  Abdominal:     General: Bowel sounds are normal.     Palpations: Abdomen is soft.     Tenderness: There is no abdominal tenderness. There is no guarding or rebound.  Musculoskeletal:        General: Normal range of motion.     Cervical back: Normal range of motion.  Skin:    General: Skin is warm and dry.  Neurological:     Mental Status: She is alert and oriented to person, place, and time.     ED Results / Procedures / Treatments   Labs (all labs ordered are listed, but only abnormal results are displayed) Labs Reviewed  COMPREHENSIVE METABOLIC  PANEL - Abnormal; Notable for the following components:      Result Value   Potassium 3.3 (*)    BUN 5 (*)    Total Bilirubin 0.2 (*)    All other components within normal limits  CBC - Abnormal; Notable for the following components:   Hemoglobin 9.3 (*)    HCT 33.3 (*)    MCV 71.0 (*)    MCH 19.8 (*)    MCHC 27.9 (*)    RDW 23.9 (*)    Platelets 440 (*)    All other components within normal limits  LIPASE, BLOOD  HCG, SERUM, QUALITATIVE  URINALYSIS, ROUTINE W REFLEX MICROSCOPIC    EKG None  Radiology No results found.  Procedures Procedures    Medications Ordered in ED Medications - No data to display  ED Course/ Medical Decision Making/ A&P                                 Medical Decision Making Amount and/or Complexity of Data Reviewed Labs:  ordered. ECG/medicine tests: ordered and independent interpretation performed.   37 year old female presenting to the ED with abdominal cramping.  She currently is on her menstrual cycle and recently took MiraLAX for constipation with production of large amount of loose stools.  She is afebrile and nontoxic in appearance.  Her abdomen is soft and nontender.  She actually states her cramping has resolved by time of my evaluation.  Labs were obtained which are grossly reassuring without leukocytosis or significant electrolyte derangement.  Normal lipase.  Suspect her abdominal cramping likely in the context of MiraLAX use.  As she is feeling better at this time, do not feel she needs further workup or advanced imaging.  Discussed supportive care.  Can follow-up with PCP.  Return here for new concerns.  Final Clinical Impression(s) / ED Diagnoses Final diagnoses:  Abdominal cramping    Rx / DC Orders ED Discharge Orders     None         Garlon Hatchet, PA-C 04/24/23 0527    Tilden Fossa, MD 04/24/23 [phone]

## 2023-04-24 NOTE — ED Provider Triage Note (Signed)
Emergency Medicine Provider Triage Evaluation Note  Michelle [name] , a 37 y.o. female  was evaluated in triage.  Pt complains of constipation for 1 week took MiraLAX now having diarrhea she just started her menstrual cycle and has generalized abdominal cramping.  Review of Systems  Positive: Diarrhea abdominal cramping Negative: Nausea vomiting  Physical Exam  BP 111/62   Pulse 66   Temp 98.2 F (36.8 C) (Oral)   Resp 15   LMP 04/23/2023 (Exact Date)   SpO2 100%  Gen:   Awake, no distress   Resp:  Normal effort  MSK:   Moves extremities without difficulty  Other:    Medical Decision Making  Medically screening exam initiated at 12:51 AM.  Appropriate orders placed.  Michelle [name] was informed that the remainder of the evaluation will be completed by another provider, this initial triage assessment does not replace that evaluation, and the importance of remaining in the ED until their evaluation is complete.  Lab work has been ordered will need further workup.   Carroll Sage, PA-C 04/24/23 [phone]

## 2023-07-31 ENCOUNTER — Ambulatory Visit (HOSPITAL_COMMUNITY): Payer: Medicaid Other

## 2023-08-01 ENCOUNTER — Ambulatory Visit (HOSPITAL_COMMUNITY)
Admission: EM | Admit: 2023-08-01 | Discharge: 2023-08-01 | Disposition: A | Discharge status: Home / Self Care | Payer: Medicaid Other | Attending: Emergency Medicine | Admitting: Emergency Medicine

## 2023-08-01 ENCOUNTER — Encounter (HOSPITAL_COMMUNITY): Payer: Self-pay | Admitting: *Deleted

## 2023-08-01 ENCOUNTER — Other Ambulatory Visit: Payer: Self-pay

## 2023-08-01 DIAGNOSIS — Z113 Encounter for screening for infections with a predominantly sexual mode of transmission: Secondary | ICD-10-CM | POA: Insufficient documentation

## 2023-08-01 DIAGNOSIS — K5900 Constipation, unspecified: Secondary | ICD-10-CM | POA: Insufficient documentation

## 2023-08-01 DIAGNOSIS — R143 Flatulence: Secondary | ICD-10-CM | POA: Insufficient documentation

## 2023-08-01 DIAGNOSIS — R109 Unspecified abdominal pain: Secondary | ICD-10-CM | POA: Diagnosis present

## 2023-08-01 LAB — POCT URINALYSIS DIP (MANUAL ENTRY)
Bilirubin, UA: NEGATIVE
Blood, UA: NEGATIVE
Glucose, UA: NEGATIVE mg/dL
Nitrite, UA: NEGATIVE
Spec Grav, UA: >=1.03 — AB (ref 1.010–1.025)
Urobilinogen, UA: 0.2 U/dL
pH, UA: 6 (ref 5.0–8.0)

## 2023-08-01 LAB — POCT URINE PREGNANCY: Preg Test, Ur: NEGATIVE

## 2023-08-01 NOTE — ED Triage Notes (Addendum)
Pt reports for 4 days she has had a vaginal odor with out discharge. Pt also reports pelvic pain . Pt also reports burning to skin under Lt breast.

## 2023-08-01 NOTE — ED Provider Notes (Signed)
[facility]    CSN: [id number] Arrival date & time: 08/01/23  4166     History   Chief Complaint Chief Complaint  Patient presents with   Abdominal Pain    HPI Michelle [name] is a 37 y.o. female.  Patient is poor historian, symptoms are vague and seem to jump around from different body parts. It seems maybe 4 day history of lower abdominal discomfort. Not severe, and not occurring right now. She reports constipation, took a laxative and had BM this morning that may have improved symptoms. Maybe some vaginal discharge and odor, although doesn't know if it's her urine. No fever. Not having dysuria, urgency, frequency No nausea or vomiting. LMP 12/2 She is sexually active   Also reports reflux that she doesn't take medicines for "but Im supposed to". She is not having upper abd pain currently   Initially triaged as "pelvic pain", but with further questioning she meant lower abdomen. Not having pelvic pain or dyspareunia   Past Medical History:  Diagnosis Date   Anemia    On Iron Supplements   Anxiety    Chlamydia    Insomnia     Patient Active Problem List   Diagnosis Date Noted   Chronic pain of right thumb 11/28/2021   History of trichomoniasis 05/28/2019   History of gonorrhea 05/28/2019   Family history of congenital anomalies 05/28/2018   Uses marijuana 05/28/2018   Social problem 05/27/2018   Dental caries 05/27/2018    Past Surgical History:  Procedure Laterality Date   EYE SURGERY Left     OB History     Gravida  4   Para  4   Term  4   Preterm  0   AB  0   Living  4      SAB  0   IAB  0   Ectopic  0   Multiple      Live Births  4            Home Medications    Prior to Admission medications   Not on File    Family History History reviewed. No pertinent family history.  Social History Social History   Tobacco Use   Smoking status: Some Days   Smokeless tobacco: Never  Vaping Use   Vaping status:  Never Used  Substance Use Topics   Alcohol use: No   Drug use: Yes    Types: Marijuana    Comment: states she quit, positive lab tes     Allergies   Latex   Review of Systems Review of Systems Per HPI  Physical Exam Triage Vital Signs ED Triage Vitals  Encounter Vitals Group     BP 08/01/23 1044 97/66     Systolic BP Percentile --      Diastolic BP Percentile --      Pulse Rate 08/01/23 1044 87     Resp 08/01/23 1044 20     Temp 08/01/23 1044 98.3 F (36.8 C)     Temp src --      SpO2 08/01/23 1044 100 %     Weight --      Height --      Head Circumference --      Peak Flow --      Pain Score 08/01/23 1041 0     Pain Loc --      Pain Education --      Exclude from Growth Chart --  No data found.  Updated Vital Signs BP 97/66   Pulse 87   Temp 98.3 F (36.8 C)   Resp 20   LMP 07/15/2023   SpO2 100%     Physical Exam Vitals and nursing note reviewed.  Constitutional:      General: She is not in acute distress.    Appearance: Normal appearance.  HENT:     Mouth/Throat:     Mouth: Mucous membranes are moist.     Pharynx: Oropharynx is clear.  Eyes:     Conjunctiva/sclera: Conjunctivae normal.  Cardiovascular:     Rate and Rhythm: Normal rate and regular rhythm.     Heart sounds: Normal heart sounds.  Pulmonary:     Effort: Pulmonary effort is normal.     Breath sounds: Normal breath sounds.  Abdominal:     Palpations: Abdomen is soft.     Tenderness: There is no abdominal tenderness. There is no right CVA tenderness, left CVA tenderness, guarding or rebound.  Musculoskeletal:        General: Normal range of motion.  Skin:    General: Skin is warm and dry.  Neurological:     Mental Status: She is alert and oriented to person, place, and time.     UC Treatments / Results  Labs (all labs ordered are listed, but only abnormal results are displayed) Labs Reviewed  POCT URINALYSIS DIP (MANUAL ENTRY) - Abnormal; Notable for the following  components:      Result Value   Ketones, POC UA trace (5) (*)    Spec Grav, UA >=1.030 (*)    Protein Ur, POC trace (*)    Leukocytes, UA Small (1+) (*)    All other components within normal limits  URINE CULTURE  POCT URINE PREGNANCY  CERVICOVAGINAL ANCILLARY ONLY    EKG  Radiology No results found.  Procedures Procedures  Medications Ordered in UC Medications - No data to display  Initial Impression / Assessment and Plan / UC Course  I have reviewed the triage vital signs and the nursing notes.  Pertinent labs & imaging results that were available during my care of the patient were reviewed by me and considered in my medical decision making (see chart for details).  UPT negative UA small leuks, will culture. Elevated spec grav. Increase fluids. Cytology swab is pending. Treat positive as indicated. Normal exam and no red flags. She does not seem to have symptoms currently. We discussed things to try for symptoms if they return, and regulating BMs with increased fiber/water, using miralax if needed. Advised PCP follow up. Return and ED precautions. Patient agrees to plan   Final Clinical Impressions(s) / UC Diagnoses   Final diagnoses:  Abdominal discomfort  Flatulence  Constipation, unspecified constipation type  Screen for STD (sexually transmitted disease)     Discharge Instructions      I recommend trying miralax for constipation. 1 capful is 1 dose. Use daily for the next several days.   We will call you if anything on your swab returns positive. You can also see these results on MyChart. Please abstain from sexual intercourse until your results return.  Increase water intake as much as tolerated  I recommend using antiacid medicine like Pepcid 1-2 times daily if needed     ED Prescriptions   None    PDMP not reviewed this encounter.   Marlow Baars, Cordelia Poche 08/01/23 1212

## 2023-08-01 NOTE — Discharge Instructions (Signed)
I recommend trying miralax for constipation. 1 capful is 1 dose. Use daily for the next several days.   We will call you if anything on your swab returns positive. You can also see these results on MyChart. Please abstain from sexual intercourse until your results return.  Increase water intake as much as tolerated  I recommend using antiacid medicine like Pepcid 1-2 times daily if needed

## 2023-08-02 ENCOUNTER — Telehealth (HOSPITAL_COMMUNITY): Payer: Self-pay

## 2023-08-02 LAB — URINE CULTURE: Culture: <10000 — AB

## 2023-08-02 LAB — CERVICOVAGINAL ANCILLARY ONLY
Bacterial Vaginitis (gardnerella): POSITIVE — AB
Candida Glabrata: POSITIVE — AB
Candida Vaginitis: NEGATIVE
Chlamydia: NEGATIVE
Comment: NEGATIVE
Comment: NEGATIVE
Comment: NEGATIVE
Comment: NEGATIVE
Comment: NEGATIVE
Comment: NORMAL
Neisseria Gonorrhea: NEGATIVE
Trichomonas: NEGATIVE

## 2023-08-02 MED ORDER — METRONIDAZOLE 500 MG PO TABS: 500.0000 mg | ORAL_TABLET | Freq: Two times a day (BID) | ORAL | 0 refills | Status: AC

## 2023-08-02 NOTE — Telephone Encounter (Signed)
Per protocol, pt requires tx with metronidazole. Rx sent to pharmacy on file.

## 2023-08-09 ENCOUNTER — Telehealth (HOSPITAL_COMMUNITY): Payer: Self-pay

## 2023-08-09 NOTE — Telephone Encounter (Signed)
Informed by front desk: "Pt above just called and stated she missed call from the nurse, best contact number [phone]"    Patient informed of results and verbalized understanding.

## 2023-08-09 NOTE — Telephone Encounter (Signed)
Left message to return call 

## 2023-08-09 NOTE — Telephone Encounter (Signed)
Informed by front desk:"The above pt. was seen here and has not received Lab results. Contact number [phone]"      Warren Danes, RN 08/08/2023  1:51 PM EST Back to Top    Attempted to reach patient x3. Unable to LVM.   Warren Danes, RN 08/06/2023 11:45 AM EST     Attempted to reach patient x2. Unable to LVM.   Warren Danes, RN 08/05/2023  1:26 PM EST     Attempted to reach patient x1. Unable to LVM   Warren Danes, RN 08/02/2023  1:15 PM EST     Per protocol, pt requires tx with metronidazole. Rx sent to pharmacy on file.
